# Patient Record
Sex: Female | Born: 1945 | Race: White | Hispanic: No | Marital: Married | Smoking: Never smoker
Health system: Southern US, Community
[De-identification: ages and names within clinical notes are randomized; demographics above are authoritative.]

## PROBLEM LIST (undated history)

## (undated) DIAGNOSIS — I4891 Unspecified atrial fibrillation: Secondary | ICD-10-CM

## (undated) DIAGNOSIS — R945 Abnormal results of liver function studies: Secondary | ICD-10-CM

## (undated) DIAGNOSIS — I2510 Atherosclerotic heart disease of native coronary artery without angina pectoris: Secondary | ICD-10-CM

## (undated) DIAGNOSIS — I471 Supraventricular tachycardia, unspecified: Secondary | ICD-10-CM

## (undated) DIAGNOSIS — N852 Hypertrophy of uterus: Secondary | ICD-10-CM

## (undated) DIAGNOSIS — K219 Gastro-esophageal reflux disease without esophagitis: Secondary | ICD-10-CM

## (undated) DIAGNOSIS — F5104 Psychophysiologic insomnia: Secondary | ICD-10-CM

## (undated) DIAGNOSIS — R0789 Other chest pain: Secondary | ICD-10-CM

## (undated) DIAGNOSIS — N890 Mild vaginal dysplasia: Secondary | ICD-10-CM

## (undated) DIAGNOSIS — E785 Hyperlipidemia, unspecified: Secondary | ICD-10-CM

## (undated) DIAGNOSIS — K5790 Diverticulosis of intestine, part unspecified, without perforation or abscess without bleeding: Secondary | ICD-10-CM

## (undated) DIAGNOSIS — A048 Other specified bacterial intestinal infections: Secondary | ICD-10-CM

## (undated) DIAGNOSIS — K589 Irritable bowel syndrome without diarrhea: Secondary | ICD-10-CM

## (undated) DIAGNOSIS — K760 Fatty (change of) liver, not elsewhere classified: Secondary | ICD-10-CM

## (undated) DIAGNOSIS — F419 Anxiety disorder, unspecified: Secondary | ICD-10-CM

## (undated) DIAGNOSIS — R7989 Other specified abnormal findings of blood chemistry: Secondary | ICD-10-CM

## (undated) HISTORY — DX: Abnormal results of liver function studies: R94.5

## (undated) HISTORY — DX: Hypertrophy of uterus: N85.2

## (undated) HISTORY — PX: TONSILLECTOMY: SUR1361

## (undated) HISTORY — DX: Fatty (change of) liver, not elsewhere classified: K76.0

## (undated) HISTORY — DX: Gastro-esophageal reflux disease without esophagitis: K21.9

## (undated) HISTORY — DX: Mild vaginal dysplasia: N89.0

## (undated) HISTORY — DX: Supraventricular tachycardia, unspecified: I47.10

## (undated) HISTORY — DX: Irritable bowel syndrome without diarrhea: K58.9

## (undated) HISTORY — DX: Atherosclerotic heart disease of native coronary artery without angina pectoris: I25.10

## (undated) HISTORY — DX: Other specified bacterial intestinal infections: A04.8

## (undated) HISTORY — DX: Supraventricular tachycardia: I47.1

## (undated) HISTORY — PX: PELVIC LAPAROSCOPY: SHX162

## (undated) HISTORY — DX: Psychophysiologic insomnia: F51.04

## (undated) HISTORY — DX: Other chest pain: R07.89

## (undated) HISTORY — PX: DILATION AND CURETTAGE, DIAGNOSTIC / THERAPEUTIC: SUR384

## (undated) HISTORY — DX: Anxiety disorder, unspecified: F41.9

## (undated) HISTORY — DX: Hyperlipidemia, unspecified: E78.5

## (undated) HISTORY — DX: Other specified abnormal findings of blood chemistry: R79.89

## (undated) HISTORY — PX: OTHER SURGICAL HISTORY: SHX169

## (undated) HISTORY — DX: Unspecified atrial fibrillation: I48.91

## (undated) HISTORY — PX: BREAST BIOPSY: SHX20

## (undated) HISTORY — DX: Diverticulosis of intestine, part unspecified, without perforation or abscess without bleeding: K57.90

## (undated) HISTORY — PX: REPAIR RECTOCELE: SUR1206

---

## 1972-02-15 HISTORY — PX: LAPAROSCOPY: SHX197

## 1997-12-22 ENCOUNTER — Other Ambulatory Visit: Admission: RE | Admit: 1997-12-22 | Discharge: 1997-12-22 | Payer: Self-pay | Admitting: Obstetrics and Gynecology

## 1998-04-27 ENCOUNTER — Ambulatory Visit (HOSPITAL_COMMUNITY): Admission: RE | Admit: 1998-04-27 | Discharge: 1998-04-27 | Payer: Self-pay | Admitting: Obstetrics and Gynecology

## 1998-04-27 ENCOUNTER — Encounter: Payer: Self-pay | Admitting: Obstetrics and Gynecology

## 1998-08-26 ENCOUNTER — Encounter: Payer: Self-pay | Admitting: Gastroenterology

## 1998-08-26 ENCOUNTER — Ambulatory Visit (HOSPITAL_COMMUNITY): Admission: RE | Admit: 1998-08-26 | Discharge: 1998-08-26 | Payer: Self-pay | Admitting: Gastroenterology

## 1999-03-18 HISTORY — PX: LIVER BIOPSY: SHX301

## 1999-03-18 HISTORY — PX: BREAST BIOPSY: SHX20

## 1999-04-06 ENCOUNTER — Ambulatory Visit (HOSPITAL_COMMUNITY): Admission: RE | Admit: 1999-04-06 | Discharge: 1999-04-06 | Payer: Self-pay | Admitting: Gastroenterology

## 1999-04-06 ENCOUNTER — Encounter (INDEPENDENT_AMBULATORY_CARE_PROVIDER_SITE_OTHER): Payer: Self-pay

## 1999-04-06 ENCOUNTER — Encounter: Payer: Self-pay | Admitting: Gastroenterology

## 1999-05-13 ENCOUNTER — Encounter: Admission: RE | Admit: 1999-05-13 | Discharge: 1999-08-11 | Payer: Self-pay | Admitting: Gastroenterology

## 1999-09-21 ENCOUNTER — Ambulatory Visit (HOSPITAL_COMMUNITY): Admission: RE | Admit: 1999-09-21 | Discharge: 1999-09-21 | Payer: Self-pay | Admitting: Obstetrics and Gynecology

## 1999-09-21 ENCOUNTER — Encounter (INDEPENDENT_AMBULATORY_CARE_PROVIDER_SITE_OTHER): Payer: Self-pay | Admitting: Specialist

## 1999-11-01 ENCOUNTER — Encounter: Admission: RE | Admit: 1999-11-01 | Discharge: 1999-11-01 | Payer: Self-pay | Admitting: Gynecology

## 1999-11-01 ENCOUNTER — Encounter: Payer: Self-pay | Admitting: Gynecology

## 1999-11-10 ENCOUNTER — Ambulatory Visit (HOSPITAL_COMMUNITY): Admission: RE | Admit: 1999-11-10 | Discharge: 1999-11-11 | Payer: Self-pay | Admitting: Specialist

## 2000-10-15 HISTORY — PX: OTHER SURGICAL HISTORY: SHX169

## 2000-10-15 HISTORY — PX: ROTATOR CUFF REPAIR: SHX139

## 2001-05-04 ENCOUNTER — Other Ambulatory Visit: Admission: RE | Admit: 2001-05-04 | Discharge: 2001-05-04 | Payer: Self-pay | Admitting: Gynecology

## 2001-11-20 ENCOUNTER — Ambulatory Visit (HOSPITAL_COMMUNITY): Admission: RE | Admit: 2001-11-20 | Discharge: 2001-11-20 | Payer: Self-pay | Admitting: Internal Medicine

## 2001-11-20 ENCOUNTER — Encounter: Payer: Self-pay | Admitting: Internal Medicine

## 2003-01-02 ENCOUNTER — Encounter: Admission: RE | Admit: 2003-01-02 | Discharge: 2003-01-02 | Payer: Self-pay | Admitting: Gynecology

## 2003-01-02 ENCOUNTER — Ambulatory Visit (HOSPITAL_COMMUNITY): Admission: RE | Admit: 2003-01-02 | Discharge: 2003-01-02 | Payer: Self-pay | Admitting: Internal Medicine

## 2003-02-15 HISTORY — PX: ELECTROPHYSIOLOGIC STUDY: SHX172A

## 2003-02-17 ENCOUNTER — Ambulatory Visit (HOSPITAL_COMMUNITY): Admission: RE | Admit: 2003-02-17 | Discharge: 2003-02-18 | Payer: Self-pay | Admitting: Internal Medicine

## 2003-05-16 DIAGNOSIS — A048 Other specified bacterial intestinal infections: Secondary | ICD-10-CM

## 2003-05-16 HISTORY — DX: Other specified bacterial intestinal infections: A04.8

## 2003-06-12 ENCOUNTER — Ambulatory Visit (HOSPITAL_COMMUNITY): Admission: RE | Admit: 2003-06-12 | Discharge: 2003-06-12 | Payer: Self-pay | Admitting: Cardiology

## 2003-11-20 ENCOUNTER — Other Ambulatory Visit: Admission: RE | Admit: 2003-11-20 | Discharge: 2003-11-20 | Payer: Self-pay | Admitting: Obstetrics and Gynecology

## 2004-01-05 ENCOUNTER — Encounter: Admission: RE | Admit: 2004-01-05 | Discharge: 2004-01-05 | Payer: Self-pay | Admitting: Internal Medicine

## 2004-01-05 ENCOUNTER — Ambulatory Visit: Payer: Self-pay | Admitting: Internal Medicine

## 2004-02-24 ENCOUNTER — Ambulatory Visit: Payer: Self-pay

## 2004-02-24 ENCOUNTER — Ambulatory Visit: Payer: Self-pay | Admitting: Internal Medicine

## 2004-02-24 ENCOUNTER — Encounter: Admission: RE | Admit: 2004-02-24 | Discharge: 2004-02-24 | Payer: Self-pay | Admitting: Obstetrics and Gynecology

## 2004-02-26 ENCOUNTER — Ambulatory Visit: Payer: Self-pay

## 2004-03-23 ENCOUNTER — Encounter (INDEPENDENT_AMBULATORY_CARE_PROVIDER_SITE_OTHER): Payer: Self-pay | Admitting: *Deleted

## 2004-03-23 ENCOUNTER — Inpatient Hospital Stay (HOSPITAL_COMMUNITY): Admission: RE | Admit: 2004-03-23 | Discharge: 2004-03-24 | Payer: Self-pay | Admitting: Obstetrics and Gynecology

## 2004-03-23 HISTORY — PX: VAGINAL HYSTERECTOMY: SUR661

## 2004-12-09 ENCOUNTER — Ambulatory Visit: Payer: Self-pay | Admitting: Internal Medicine

## 2005-01-10 ENCOUNTER — Ambulatory Visit: Payer: Self-pay | Admitting: Internal Medicine

## 2005-01-10 ENCOUNTER — Encounter (INDEPENDENT_AMBULATORY_CARE_PROVIDER_SITE_OTHER): Payer: Self-pay | Admitting: Specialist

## 2005-02-14 HISTORY — PX: COLPOSCOPY: SHX161

## 2005-04-11 ENCOUNTER — Ambulatory Visit (HOSPITAL_COMMUNITY): Admission: RE | Admit: 2005-04-11 | Discharge: 2005-04-11 | Payer: Self-pay | Admitting: Internal Medicine

## 2005-04-25 ENCOUNTER — Ambulatory Visit: Payer: Self-pay | Admitting: Internal Medicine

## 2005-09-15 ENCOUNTER — Ambulatory Visit: Payer: Self-pay | Admitting: Gastroenterology

## 2005-12-30 ENCOUNTER — Other Ambulatory Visit: Admission: RE | Admit: 2005-12-30 | Discharge: 2005-12-30 | Payer: Self-pay | Admitting: Obstetrics and Gynecology

## 2006-02-14 HISTORY — PX: CARDIAC CATHETERIZATION: SHX172

## 2006-05-11 ENCOUNTER — Encounter: Admission: RE | Admit: 2006-05-11 | Discharge: 2006-05-11 | Payer: Self-pay | Admitting: Endocrinology

## 2006-05-17 ENCOUNTER — Other Ambulatory Visit: Admission: RE | Admit: 2006-05-17 | Discharge: 2006-05-17 | Payer: Self-pay | Admitting: Obstetrics and Gynecology

## 2006-05-24 ENCOUNTER — Ambulatory Visit (HOSPITAL_COMMUNITY): Admission: RE | Admit: 2006-05-24 | Discharge: 2006-05-24 | Payer: Self-pay | Admitting: Internal Medicine

## 2006-06-05 ENCOUNTER — Ambulatory Visit: Payer: Self-pay | Admitting: Internal Medicine

## 2006-06-05 LAB — CONVERTED CEMR LAB
BUN: 13 mg/dL (ref 6–23)
Basophils Absolute: 0 10*3/uL (ref 0.0–0.1)
Basophils Relative: 0.2 % (ref 0.0–1.0)
CO2: 29 meq/L (ref 19–32)
Calcium: 9.5 mg/dL (ref 8.4–10.5)
Creatinine, Ser: 0.9 mg/dL (ref 0.4–1.2)
Hemoglobin: 15.2 g/dL — ABNORMAL HIGH (ref 12.0–15.0)
INR: 0.9 (ref 0.9–2.0)
MCHC: 34.7 g/dL (ref 30.0–36.0)
Monocytes Absolute: 0.7 10*3/uL (ref 0.2–0.7)
Monocytes Relative: 7.8 % (ref 3.0–11.0)
Platelets: 215 10*3/uL (ref 150–400)
Potassium: 5 meq/L (ref 3.5–5.1)
Prothrombin Time: 11.4 s (ref 10.0–14.0)
RBC: 4.88 M/uL (ref 3.87–5.11)
RDW: 12.9 % (ref 11.5–14.6)
aPTT: 26.5 s (ref 26.5–36.5)

## 2006-06-07 ENCOUNTER — Inpatient Hospital Stay (HOSPITAL_BASED_OUTPATIENT_CLINIC_OR_DEPARTMENT_OTHER): Admission: RE | Admit: 2006-06-07 | Discharge: 2006-06-07 | Payer: Self-pay | Admitting: Cardiovascular Disease

## 2006-06-07 ENCOUNTER — Ambulatory Visit: Payer: Self-pay | Admitting: Cardiovascular Disease

## 2006-07-05 ENCOUNTER — Ambulatory Visit: Payer: Self-pay | Admitting: Internal Medicine

## 2007-01-04 ENCOUNTER — Other Ambulatory Visit: Admission: RE | Admit: 2007-01-04 | Discharge: 2007-01-04 | Payer: Self-pay | Admitting: Obstetrics and Gynecology

## 2007-03-23 ENCOUNTER — Ambulatory Visit: Payer: Self-pay | Admitting: Internal Medicine

## 2007-03-23 LAB — CONVERTED CEMR LAB
Albumin: 3.5 g/dL (ref 3.5–5.2)
Alkaline Phosphatase: 128 units/L — ABNORMAL HIGH (ref 39–117)
Basophils Absolute: 0 10*3/uL (ref 0.0–0.1)
Cholesterol: 263 mg/dL (ref 0–200)
Direct LDL: 196.8 mg/dL
HDL: 34.4 mg/dL — ABNORMAL LOW (ref >39.0)
Hemoglobin: 14.4 g/dL (ref 12.0–15.0)
Lymphocytes Relative: 36.7 % (ref 12.0–46.0)
MCHC: 33.9 g/dL (ref 30.0–36.0)
Monocytes Absolute: 0.8 10*3/uL — ABNORMAL HIGH (ref 0.2–0.7)
Monocytes Relative: 9.5 % (ref 3.0–11.0)
Neutro Abs: 4.1 10*3/uL (ref 1.4–7.7)
Platelets: 197 10*3/uL (ref 150–400)
RDW: 12.6 % (ref 11.5–14.6)
Total CHOL/HDL Ratio: 7.6
Uric Acid, Serum: 6.7 mg/dL (ref 2.4–7.0)

## 2007-03-26 ENCOUNTER — Ambulatory Visit: Payer: Self-pay | Admitting: Internal Medicine

## 2007-03-27 ENCOUNTER — Ambulatory Visit (HOSPITAL_COMMUNITY): Admission: RE | Admit: 2007-03-27 | Discharge: 2007-03-27 | Payer: Self-pay | Admitting: Internal Medicine

## 2007-03-27 ENCOUNTER — Encounter: Payer: Self-pay | Admitting: Internal Medicine

## 2007-03-27 ENCOUNTER — Ambulatory Visit: Payer: Self-pay | Admitting: Internal Medicine

## 2007-04-27 ENCOUNTER — Ambulatory Visit: Payer: Self-pay | Admitting: Internal Medicine

## 2007-05-22 ENCOUNTER — Ambulatory Visit: Payer: Self-pay | Admitting: Internal Medicine

## 2007-05-22 ENCOUNTER — Encounter: Payer: Self-pay | Admitting: Internal Medicine

## 2007-07-04 ENCOUNTER — Other Ambulatory Visit: Admission: RE | Admit: 2007-07-04 | Discharge: 2007-07-04 | Payer: Self-pay | Admitting: Obstetrics and Gynecology

## 2007-07-26 ENCOUNTER — Ambulatory Visit (HOSPITAL_COMMUNITY): Admission: RE | Admit: 2007-07-26 | Discharge: 2007-07-26 | Payer: Self-pay | Admitting: Obstetrics and Gynecology

## 2008-03-27 ENCOUNTER — Ambulatory Visit: Payer: Self-pay | Admitting: Obstetrics and Gynecology

## 2008-03-27 ENCOUNTER — Encounter: Payer: Self-pay | Admitting: Obstetrics and Gynecology

## 2008-03-27 ENCOUNTER — Other Ambulatory Visit: Admission: RE | Admit: 2008-03-27 | Discharge: 2008-03-27 | Payer: Self-pay | Admitting: Obstetrics and Gynecology

## 2008-04-28 ENCOUNTER — Encounter: Payer: Self-pay | Admitting: Internal Medicine

## 2008-05-07 ENCOUNTER — Ambulatory Visit (HOSPITAL_COMMUNITY): Admission: RE | Admit: 2008-05-07 | Discharge: 2008-05-07 | Payer: Self-pay | Admitting: Internal Medicine

## 2008-05-07 ENCOUNTER — Ambulatory Visit (HOSPITAL_COMMUNITY): Admission: RE | Admit: 2008-05-07 | Discharge: 2008-05-07 | Payer: Self-pay | Admitting: Obstetrics and Gynecology

## 2008-06-20 DIAGNOSIS — Z8679 Personal history of other diseases of the circulatory system: Secondary | ICD-10-CM | POA: Insufficient documentation

## 2008-06-20 DIAGNOSIS — R0789 Other chest pain: Secondary | ICD-10-CM | POA: Insufficient documentation

## 2008-07-01 ENCOUNTER — Ambulatory Visit: Payer: Self-pay | Admitting: Internal Medicine

## 2008-11-20 ENCOUNTER — Ambulatory Visit: Payer: Self-pay | Admitting: Obstetrics and Gynecology

## 2009-04-13 ENCOUNTER — Other Ambulatory Visit: Admission: RE | Admit: 2009-04-13 | Discharge: 2009-04-13 | Payer: Self-pay | Admitting: Obstetrics and Gynecology

## 2009-04-13 ENCOUNTER — Ambulatory Visit: Payer: Self-pay | Admitting: Obstetrics and Gynecology

## 2009-06-23 ENCOUNTER — Ambulatory Visit (HOSPITAL_COMMUNITY): Admission: RE | Admit: 2009-06-23 | Discharge: 2009-06-23 | Payer: Self-pay | Admitting: Internal Medicine

## 2010-03-07 ENCOUNTER — Encounter (HOSPITAL_BASED_OUTPATIENT_CLINIC_OR_DEPARTMENT_OTHER): Payer: Self-pay | Admitting: Internal Medicine

## 2010-04-14 ENCOUNTER — Encounter: Payer: Self-pay | Admitting: Obstetrics and Gynecology

## 2010-05-25 LAB — POC HEMOCCULT BLD/STL (HOME/3-CARD/SCREEN)

## 2010-06-02 ENCOUNTER — Telehealth: Payer: Self-pay | Admitting: Internal Medicine

## 2010-06-02 NOTE — Telephone Encounter (Signed)
Spoke with Malachi Bonds and scheduled patient on 06/08/10 at 8:45 AM. Malachi Bonds to fax recent records.

## 2010-06-07 ENCOUNTER — Encounter: Payer: Self-pay | Admitting: Internal Medicine

## 2010-06-08 ENCOUNTER — Ambulatory Visit (INDEPENDENT_AMBULATORY_CARE_PROVIDER_SITE_OTHER): Payer: Medicare Other | Admitting: Internal Medicine

## 2010-06-08 ENCOUNTER — Encounter: Payer: Self-pay | Admitting: Internal Medicine

## 2010-06-08 DIAGNOSIS — K7689 Other specified diseases of liver: Secondary | ICD-10-CM

## 2010-06-08 DIAGNOSIS — R1319 Other dysphagia: Secondary | ICD-10-CM

## 2010-06-08 DIAGNOSIS — K760 Fatty (change of) liver, not elsewhere classified: Secondary | ICD-10-CM

## 2010-06-08 DIAGNOSIS — R1033 Periumbilical pain: Secondary | ICD-10-CM

## 2010-06-08 DIAGNOSIS — R195 Other fecal abnormalities: Secondary | ICD-10-CM

## 2010-06-08 MED ORDER — FLUCONAZOLE 100 MG PO TABS: ORAL_TABLET | ORAL | Status: DC

## 2010-06-08 MED ORDER — PEG-KCL-NACL-NASULF-NA ASC-C 100 G PO SOLR: 1.0000 | Freq: Once | ORAL | Status: AC

## 2010-06-08 NOTE — Patient Instructions (Addendum)
You have been scheduled for an endoscopy and colonoscopy. Please follow the written instructions given to you at your visit today. You  Have been scheduled for an abdominal ultrasound at [facility] on Tuesday 06/15/10 @8 :30 am . Please be certain not to have anything to eat or drink 6 hours prior to your test. Arrive at 8:15 am for registration. Please pick up your Moviprep at the pharmacy. Please pick up your Diflucan at the pharmacy. You should take 1 tablet daily x 3 days.

## 2010-06-08 NOTE — Progress Notes (Signed)
Heidi Chase 1945/04/18 MRN [id number]      History of Present Illness:  This is a 65 year old white female who had heme-positive stool on a test by Dr [name]. She has been complaining of upper abdominal discomfort and bloating. She has complaints of dysphagia to solids and liquids. She has been on Nexium 40 mg twice a day. Her bowel habits have been regular. She has noticed occasional small amounts of bright red blood per rectum. We have seen her in the past on multiple occasions. She has had colonoscopies in 1996, 2005 and in 2009 because of her family history of colon cancer in her father. She had 2 tubular adenomas in 2009. An upper endoscopy in 2005 showed H. pylori gastropathy. Her last endoscopy in 2009 was normal. She has fatty liver on an upper abdominal ultrasound in February 2009. Her gallbladder appeared normal and the common bile duct was 2 millimeters. In 2007, she was treated for antibiotic induced diarrhea. She had a liver biopsy in 2001 which showed severe steatohepatitis but no fibrosis.   Past Medical History  Diagnosis Date  . Hyperlipidemia   . Fatty liver   . SVT (supraventricular tachycardia)   . Uterine hyperplasia   . Positive H. pylori test 05/2003    positive x 2  . Diabetes mellitus   . Atrial fibrillation   . Elevated LFTs   . IBS (irritable bowel syndrome)   . GERD (gastroesophageal reflux disease)   . Diverticulosis   . Anxiety    Past Surgical History  Procedure Date  . Rotator cuff repair 10/2000    left  . Dilation and curettage, diagnostic / therapeutic   . Tonsillectomy   . Laparoscopy 1974  . Thumb surgery 10/2000  . Breast biopsy     left  . Total abdominal hysterectomy 03/23/04    reports that she has quit smoking. She has never used smokeless tobacco. She reports that she does not drink alcohol or use illicit drugs. family history includes Colon cancer in her father; Diabetes in her sister; Heart attack in her maternal grandfather,  maternal grandmother, mother, and paternal grandfather; Heart disease in her father and sister; and Uterine cancer in her sister. No Known Allergies      Review of Systems: Denies chest pain, shortness of breath. Admits to dysphagia. Positive for abdominal pain. Positive for rectal bleeding.  The remainder of the 10  point ROS is negative except as outlined in H&P   Physical Exam: General appearance  Well developed, in no distress. Eyes- non icteric. HEENT nontraumatic, normocephalic. Mouth no lesions, tongue papillated, no cheilosis. Neck supple without adenopathy, thyroid not enlarged, no carotid bruits, no JVD. Lungs Clear to auscultation bilaterally. Cor normal S1 normal S2, regular rhythm , no murmur,  quiet precordium. Abdomen protuberant abdomen, soft, normoactive bowel sounds. Tenderness across upper abdomen but predominantly in the epigastrium. Tenderness in left lower quadrant. No palpable mass, no rebound. Rectal soft Hemoccult negative stool. Extremities no pedal edema. Skin no lesions. Neurological alert and oriented x 3. Psychological normal mood and affect.  Assessment and Plan:  Problems #1 Patient has hemoccult-positive stool. I was unable to reproduce the same results on today's exam but, she has a strong family history of colon cancer and personal history of colon polyps and she is due for a repeat colonoscopy. The small amount of bright red blood per rectum suggests anorectal source. She has a history of irritable bowel syndrome.  Problem #2 dyspepsia and bloating. She has  a history of H. pylori. We will proceed with an upper endoscopy and assess her for an  esophageal stricture. Candida esophagitis may also cause dysphagia in a diabetic. We will treat her empirically with Diflucan 100 mg a day for 3 days. We will also obtain an upper abdominal ultrasound for followup "fatty liver". There is no stigmata of chronic liver disease on her physical exam  today.   06/08/2010 Lina Sar

## 2010-06-09 ENCOUNTER — Ambulatory Visit (AMBULATORY_SURGERY_CENTER): Payer: Medicare Other | Admitting: Internal Medicine

## 2010-06-09 ENCOUNTER — Encounter: Payer: Self-pay | Admitting: Internal Medicine

## 2010-06-09 DIAGNOSIS — R195 Other fecal abnormalities: Secondary | ICD-10-CM

## 2010-06-09 DIAGNOSIS — K625 Hemorrhage of anus and rectum: Secondary | ICD-10-CM

## 2010-06-09 DIAGNOSIS — K319 Disease of stomach and duodenum, unspecified: Secondary | ICD-10-CM

## 2010-06-09 DIAGNOSIS — R109 Unspecified abdominal pain: Secondary | ICD-10-CM

## 2010-06-09 DIAGNOSIS — R1033 Periumbilical pain: Secondary | ICD-10-CM

## 2010-06-09 DIAGNOSIS — R1319 Other dysphagia: Secondary | ICD-10-CM

## 2010-06-09 DIAGNOSIS — K219 Gastro-esophageal reflux disease without esophagitis: Secondary | ICD-10-CM

## 2010-06-09 DIAGNOSIS — K5730 Diverticulosis of large intestine without perforation or abscess without bleeding: Secondary | ICD-10-CM

## 2010-06-09 DIAGNOSIS — K921 Melena: Secondary | ICD-10-CM

## 2010-06-09 DIAGNOSIS — R0789 Other chest pain: Secondary | ICD-10-CM

## 2010-06-09 LAB — GLUCOSE, CAPILLARY: Glucose-Capillary: 81 mg/dL (ref 70–99)

## 2010-06-09 MED ORDER — SODIUM CHLORIDE 0.9 % IV SOLN: 500.0000 mL | INTRAVENOUS | Status: DC

## 2010-06-09 NOTE — Patient Instructions (Signed)
Findings:  Normal EGD                  Mild Diverticulosis, Erosion in the rectum  Recommendations:  Hold NSAIDS (aspirin products) only take tylenol for over the counter pain meds.                                   Resume PPI                                   Preparation H as needed for rectal bleeding.

## 2010-06-10 ENCOUNTER — Other Ambulatory Visit: Payer: Self-pay | Admitting: Obstetrics and Gynecology

## 2010-06-10 ENCOUNTER — Telehealth: Payer: Self-pay | Admitting: *Deleted

## 2010-06-10 NOTE — Telephone Encounter (Signed)
Follow up Call- Patient questions:  Do you have a fever, pain , or abdominal swelling? no Pain Score  0 *  Have you tolerated food without any problems? yes  Have you been able to return to your normal activities? yes  Do you have any questions about your discharge instructions: Diet   no Medications  yes Follow up visit  no  Do you have questions or concerns about your Care? no  Actions: * If pain score is 4 or above: No action needed, pain <4.  Pt had questions about NSAIDS and what she should avoid.

## 2010-06-15 ENCOUNTER — Ambulatory Visit (HOSPITAL_COMMUNITY)
Admission: RE | Admit: 2010-06-15 | Discharge: 2010-06-15 | Disposition: A | Discharge status: Home / Self Care | Payer: Medicare Other | Source: Ambulatory Visit | Attending: Internal Medicine | Admitting: Internal Medicine

## 2010-06-15 DIAGNOSIS — K7689 Other specified diseases of liver: Secondary | ICD-10-CM | POA: Insufficient documentation

## 2010-06-15 DIAGNOSIS — K760 Fatty (change of) liver, not elsewhere classified: Secondary | ICD-10-CM

## 2010-06-15 DIAGNOSIS — R1033 Periumbilical pain: Secondary | ICD-10-CM

## 2010-06-15 DIAGNOSIS — K824 Cholesterolosis of gallbladder: Secondary | ICD-10-CM | POA: Insufficient documentation

## 2010-06-15 DIAGNOSIS — R109 Unspecified abdominal pain: Secondary | ICD-10-CM | POA: Insufficient documentation

## 2010-06-15 DIAGNOSIS — E119 Type 2 diabetes mellitus without complications: Secondary | ICD-10-CM | POA: Insufficient documentation

## 2010-06-17 ENCOUNTER — Encounter: Payer: Self-pay | Admitting: Internal Medicine

## 2010-06-17 ENCOUNTER — Telehealth: Payer: Self-pay | Admitting: *Deleted

## 2010-06-17 NOTE — Telephone Encounter (Signed)
Patient given results as per Dr. Edman Circle

## 2010-06-17 NOTE — Telephone Encounter (Signed)
Message copied by Jesse Fall on Thu Jun 17, 2010  2:43 PM ------      Message from: Lina Sar      Created: Wed Jun 16, 2010 11:50 PM       Please call pt with normal sono except for fatty liver unchanged from prior exam

## 2010-06-29 NOTE — Letter (Signed)
Jul 05, 2006    Barry Dienes. [name], [name]  [address]  [place], [place] [address]   RE:  [name], [name]  MRN:  [id number]  /  DOB:  1945/02/15   Dear [name],   Heidi Chase comes in and her catheterization was normal as you know. She  continues to have chest pain as well as exercise intolerance. She has a  great deal of stress ongoing with her impending mediation with her  husband following their divorce. She has had some significant problems  with stomach cramping which is has been self limited.   CURRENT MEDICATIONS:  1. Nexium 40.  2. Lexapro that she is back on after having failed the Wellbutrin.  3. Toprol 50 mg a day.   PHYSICAL EXAMINATION:  VITAL SIGNS:  Her blood pressure is 124/86, her  pulse is 52.  LUNGS:  Clear.  HEART:  Sounds were regular.  EXTREMITIES:  Without edema.   Electrocardiogram  demonstrated sinus rhythm at 52 with intervals of  0.15/0.09/0.45. The axis was 44 degrees.   IMPRESSION:  1. Noncardiac chest pain.  2. History of atrial arrhythmia.  3. Ongoing chest pain probably gastrointestinal.  4. Exercise intolerance question related to:      a.     Chronotropic incompetence.      b.     Beta blocker effect.  5. Significant psychosocial stress.   Dan, I thought what we might try is cutting down Heidi Chase's Toprol  from 50-25 and see how it is that she did and whether there is any  significant improvement. The reduction might beneficially effect either  chronotropic incompetence or an exercise asthma component.   I have asked her to give me a call in a couple of weeks after she down  titrates from 50 to 25 mg a day but otherwise will plan to have her  followup with you and I will be glad to see her again as needed. Thank  you very much for allowing [place] to participate in her care.    Sincerely,      Duke Salvia, MD, [facility]  Electronically Signed    SCK/MedQ  DD: 07/05/2006  DT: 07/05/2006  Job #: [id number]

## 2010-06-29 NOTE — Assessment & Plan Note (Signed)
[facility]                         GASTROENTEROLOGY OFFICE NOTE   NAME:VERNONSrinika, Delone                        MRN:          [id number]  DATE:04/27/2007                            DOB:          [birth year]    [name] is a 65 year old white female with family history of colon  cancer, fatty liver documented on liver biopsy, diverticulosis of the  left colon on colonoscopies.  The last exam was done in April 2005.  She  would be due for next colonoscopy in April 2010.  She has also a history  of atrial fibrillation and H. pylori gastropathy, which was treated in  April 2005, and again in October 2006.  She comes today with episode of  bright red blood per rectum, which occurred approximately a week ago,  while traveling in her car from [place] to [place].  It was a  long trip and she had a bowel movement on the way in a restaurant, and  saw a large amount of blood.  There was also associated rectal soreness,  which since then, has subsided, after she called [place] and we gave her  Anusol-HC suppositories.  Patient denies history of hemorrhoids.  She  denies being constipated.  She denies abdominal pain.   PHYSICAL EXAM:  Blood pressure 128/86, pulse 68 and weight 165 pounds.  She was alert, oriented, in no distress.  LUNGS:  Clear to auscultation.  COR:  With normal S1, normal S2.  ABDOMEN:  Protuberant, tender in the right upper quadrant.  RECTAL:  The rectal and anoscopic exam reveals a small skin tag  externally, normal rectal tone, no significant hemorrhoids internally.  Rectal mucosa appeared normal.  Stool was Hemoccult-negative.  I could  not appreciate any fissure and patient was not particularly tender on  the digital exam.   IMPRESSION:  Patient is a 65 year old white female with episode of  hematochezia a week ago, with no noticeable rectal lesions on anoscopic  exam today.  I am not sure if she had an anal fissure which has healed  up  so we cannot really see it, or if she possibly had a low grade  diverticular bleed.  She will be due for colonoscopy next year, but  because of the circumstances and her concerns, we will proceed with  colonoscopic exam at this time.   PLAN:  1. Colonoscopy scheduled.  2. Analpram cream 2.5% samples given.  3. Continue weight-loss.  4. Continue high-fiber diet.     Hedwig Morton. Juanda Chance, MD  Electronically Signed    DMB/MedQ  DD: 04/27/2007  DT: 04/27/2007  Job #: [id number]   cc:   Barry Dienes. [name], [name]

## 2010-06-29 NOTE — Assessment & Plan Note (Signed)
[facility]                         GASTROENTEROLOGY OFFICE NOTE   NAME:[name], Heidi Chase                        MRN:          [id number]  DATE:03/26/2007                            DOB:          07-Feb-1946    [name] is a 65 year old white female who is here today with right  upper quadrant abdominal pain and history of epigastric discomfort and  dyspepsia.  We have followed her now for many years for colorectal  screening.  She has a positive family history of colon cancer in her  father and underwent several colonoscopies, our last one in April 2005.  She is due for repeat colonoscopy in April of 2010.  The patient is also  known to have steatohepatitis, documented on percutaneous liver biopsy  in February 2001 showing rather severe fatty liver without any evidence  of fibrosis.  The patient has no history of alcohol use.  She had a  positive H. pylori test on endoscopy in April 2005.  Last repeat  endoscopy in October 2006 showed negative CLO test, but she still had  acute gastritis.  She is having persistent right upper quadrant  abdominal discomfort.  This bothers her at night.  She is unable to lay  on her right side.  It is worse with moving around or bending.  It is a  constant discomfort and soreness.   MEDICATIONS:  1. Toprol XL 50 mg p.o. daily.  2. Nexium 40 mg p.o. daily.  3. Lexapro 10 mg p.o. daily.  4. Diazepam 2 mg p.r.n.   PHYSICAL EXAMINATION:  Blood pressure 142/78, pulse 68, and weight 163  pounds.  She was alert and oriented, no distress.  Moderately overweight.  LUNGS:  Clear to auscultation.  COR:  Normal S1, normal S2.  ABDOMEN:  Protuberant and very tender along the right costal margin.  The liver was about 2 cm below right costal margin.  Lower abdomen was  normal.  Left lower and upper quadrants were normal.  There was mild  right CVA tenderness and tenderness laterally.  When pounding over the  liver, the patient  had definite discomfort on the right upper quadrant,  but no discomfort on the left upper quadrant.   IMPRESSION:  A 65 year old white female with steatohepatitis with mild  hepatomegaly, but no evidence of cirrhosis by biopsy in 2001.  She has  abnormality of liver function tests that have progressed since last exam  about a year ago.  Most recently, her alkaline phosphatase is 128 with  AST of 84 and ALT of 116.  She has total cholesterol of 263 with LDL of  168.   PLAN:  1. The patient needs to start on serious weight loss which may be      effective in reducing the size of her hepatomegaly.  2. Upper endoscopy to assess epigastric discomfort and dyspepsia and      recheck H. pylori.  3. Upper abdominal ultrasound.  4. Valium refill 2 mg dispense 40 by Dr. __________.  5. Colonoscopy recall in October 2010.     Heidi Chase. Heidi Chance, MD  Electronically Signed    DMB/MedQ  DD: 03/26/2007  DT: 03/27/2007  Job #: [id number]   cc:   Heidi Chase. Heidi Chase, [name]

## 2010-07-02 NOTE — Cardiovascular Report (Signed)
NAMEMONTSERRATH, MADDING                 ACCOUNT NO.:  [id number]   MEDICAL RECORD NO.:  [id number]          PATIENT TYPE:  OIB   LOCATION:  1961                         FACILITY:  [facility]   PHYSICIAN:  Peter C. Eden Emms, MD, FACCDATE OF BIRTH:  03-08-1945   DATE OF PROCEDURE:  06/07/2006  DATE OF DISCHARGE:                            CARDIAC CATHETERIZATION   PROCEDURES:  Coronary angiography.   CORONARY RISK FACTORS:  Substernal chest pain.   Cine catheterization done with 5-French catheters from the right femoral  artery.   The patient tolerated the procedure well. Standard JR4 and  JL-4  catheters were used.   The patient did have some oozing around the sheath during the case;  however, after sheath pull, there was no significant hematoma and no  bleeding.   The left main coronary artery was normal.   Left anterior descending artery was normal.   There was a very large first diagonal branch which was normal.  The  distal LAD was somewhat small.   Circumflex coronary artery was nondominant and normal.  There were two  large obtuse marginal branches which were normal.   Right coronary artery was dominant and normal.   RAO ventriculography:  RAO ventriculography had some PVCs.  However,  overall wall motion was normal with an EF of 55%.  There was no gradient  across the aortic valve and no MR.  The aortic pressure was 130/71. LV  pressure is 130/6.   IMPRESSION:  The patient's chest pain would appear to be noncardiac in  etiology.  She will follow up Dr. [name].  Apparently there is some  family stress at this time.   She will also follow up with Dr. Graciela Husbands in regards to her history of  atrial arrhythmia.      Noralyn Pick. Eden Emms, MD, [facility]  Electronically Signed     PCN/MEDQ  D:  06/07/2006  T:  06/07/2006  Job:  [id number]   cc:   Duke Salvia, MD, [facility]  Daniel L. Eda Paschal, [name]  Barry Dienes [name], [name]

## 2010-07-02 NOTE — H&P (Signed)
[facility]  Patient:    Heidi Chase, Heidi Chase                          MRN: [id number] Attending:  Esmeralda Arthur, [name]                         History and Physical  HISTORY OF PRESENT ILLNESS:       This is a 65 year old female, para 2-0-2, admitted to the hospital for hysteroscopy and D&C because of thickened endometrium.  The patient has had no bleeding since March 1999.  She has hot flushes three to four a day, some at night, but she does not take any estrogen.  She has taken no progesterone either.                                    She also has fatty liver which has been proven by biopsy.                                    She has vaginal dryness and relates to dyspareunia and orgasms have decreased.                                    Ultrasound revealed uterus to be normal size but with a 6 mm endometrial stripe.  Her right ovary is 1.7 x 2.1 x 1.6.  her left ovary was 2 x 1.4 x 2 cm.                                    Her last menstrual period was March 1999. She does not smoke.  She does not use alcohol.  She has some coffee every day.  her nutrition, she states she is trying to do better and eat a fat-free diet.  She exercises five days a week.  REVIEW OF SYSTEMS:                She has had some vision changes.  It is time for exam of her eyes.  Cardiovascular is negative.  Respiratory is negative. Gastrointestinal: She has a flare up every once in a while.  Bladder: She gets up two to three times at night to empty.  Her left breast is sensitive under arm.  PERSONAL PAST HISTORY:            She has had no problems with heart, lungs, or stroke.  She does have irritable bowel syndrome and she has arthritis pains.  The patient had a laparoscopy in 1994 which was consistent with endometriosis.  FAMILY HISTORY:                   She has two maternal aunts with diabetes. Her oldest sister had a stroke.  Her mom, dad, and her sister have  heart disease, and they have had high blood pressure.  Her mom and dad both have had colon cancer.  PHYSICAL EXAMINATION:  GENERAL:                          Well-developed,  well-nourished female, oriented and alert.  VITAL SIGNS:                      Blood pressure 128/78.  Weight 143.  Height 5 feet 3-1/2 inches.  NECK:                             Thyroid not palpable.  LUNGS:                            Clear to percussion and auscultation.  HEART:                            Normal sinus rhythm.  ABDOMEN:                          Liver is not palpated.  Spleen is not palpated.  Hernia is not palpated.  PELVIC:                           External genitalia within normal limits. Cervix within normal limits.  In vagina, she has good support.  Cervix is epithelialized.   Ovaries not palpable.  Adnexa without masses.  Rectovaginal confirms.  IMPRESSION:                       1. Postmenopausal.                                   2. Thickened endometrium.                                   3. Not on hormone replacement therapy,                                      having hot flushes.                                   4. Vaginal dryness, to use hormone cream.                                   5. Fatty liver by biopsy.  DISPOSITION:                      She is admitted for surgery. DD:  09/20/99 TD:  09/20/99 Job: [id number]

## 2010-07-02 NOTE — Op Note (Signed)
NAME:  Heidi Chase, Heidi Chase                           ACCOUNT NO.:  [id number]   MEDICAL RECORD NO.:  [id number]                   PATIENT TYPE:  OIB   LOCATION:  2853                                 FACILITY:  [facility]   PHYSICIAN:  Duke Salvia, [name]               DATE OF BIRTH:  [birth year]   DATE OF PROCEDURE:  02/17/2003  DATE OF DISCHARGE:                                 OPERATIVE REPORT   PREOPERATIVE DIAGNOSIS:  Supraventricular tachycardia/nonsustained atrial  tachycardia.   POSTOPERATIVE DIAGNOSIS:  Multiple a trial flutters that appeared to be left-  sided as well as atrial fibrillation.   OPERATION PERFORMED:  Invasive electrophysiologic study with isoproterenol  infusion.   SURGEON:  Duke Salvia, [name]   DESCRIPTION OF PROCEDURE:  Following the obtaining of informed consent, the  patient was brought to the electrophysiology laboratory and placed on the  fluoroscopic table in supine position.  After routine prep and drape,  cardiac catheterization was performed with local anesthesia and conscious  sedation.  Noninvasive blood pressure monitoring, transcutaneous oxygen  saturation monitoring, end tidal CO2 monitoring were performed continuously  throughout the procedure.  Following the procedure, the catheters were  removed. Hemostasis was obtained and the patient was transferred to the day  hospital in stable condition.   CATHETERS:  1. A 5 French quadripolar catheter was inserted via the left femoral vein to     the AV junction to record the His electrogram.  2. A 5 French quadripolar catheter was inserted via the left femoral vein to     the right ventricular apex.  3. A 5 French quadripolar catheter was inserted via the left femoral vein to     the high right atrium and subsequently substituted for a halo catheter.  4. A 6 French octapolar catheter was inserted via the right femoral vein to     the coronary sinus.   Surface leads 1, aVF, and V1 were monitored  continuously throughout the  procedure.  Following insertion of the catheters, the stimulation protocol  included  1. Incremental atrial pacing.  2. Incremental ventricular pacing.  3. Single atrial extrastimuli at a pace cycle length of 600 msec.  4. Double atrial extrastimuli at a pace cycle length of 500 msec.  5. Burst atrial pacing.   RESULTS:  Surface electrocardiogram and basic intervals.  Rhythm is sinus.  Cycle length is 996 msec.  P-R interval is 171 msec.  QRS duration is 106 msec.  QT interval is 396 msec.  P wave duration is 106 msec.  Bundle branch block was absent.  Pre-excitation was absent.  AH interval was 109 msec.  HV interval was 50 msec.  His bundle duration was 22 msec.   AV NODAL FUNCTION:  AV Wenckebach was 370 msec.  VA Wenckebach was 550 msec.  AV nodal effective refractoriness at a pace cycle length of 600 msec was 280  msec, and AV nodal conduction was continuous.  No evidence of an accessory pathway was identified.   ARRHYTHMIAS INDUCED:  A number of atrial arrhythmias were induced with burst  atrial pacing.  Two of these were a flutter like rhythm with a cycle length  of approximately 175 msec.  The first of these had earliest right atrial  activation, His bundle electrogram and then there was bidirectional  activation of the right atrium from this point.   The second atrial flutter also had a cycle length of approximately 175 msec.  Periannular conduction incorporated 100% of the tachycardia cycle length;  however, attempts to entrain the tachycardia resulted in its termination.  In addition, the initial rhythms that were induced were much more irregular.  There was evidence of double potentials identified in the proximal coronary  sinus; these electrograms both converged and diverged as the electrograms  moved from proximal to distal suggesting that this was a passive aspect of  the circuit.   In conclusion, results of the electrophysiologic  testing identified a number  of atrial flutters as well as atrial fibrillation as inducible arrhythmias.  Further review of the patient's external monitor supported the observation  of atrial tachycardias as potential triggering mechanism as there were short  runs and longer runs of the tachycardia that had minimal warm up  acceleration but did occur in small bursts.  For now, I think medical  therapy is most appropriate.  Given the potential issues related to left  atrial ablation, a trial of antiarrhythmic drug therapy may in fact be  warranted prior to proceeding with that type of procedure.    mapping identifying a relatively caudal site, lidocaine was infiltrated 3 to  4 cm caudal to the clavicle and 1.5 cm lateral to the sternum.  An incision  was made and carried down to the layer of the prepectoral fascia using  electrocautery and sharp dissection.  A pocket was formed similarly,  hemostasis was obtained.   The wound was washed, dried and a benzoin and Steri-Strip dressing was  applied.  Sponge, needle and instrument counts were correct at the end of  the procedure according to staff.  The patient tolerated the procedure  without apparent complication.                                               Duke Salvia, [name]    SCK/MEDQ  D:  02/17/2003  T:  02/17/2003  Job:  [id number]   cc:   Barry Dienes. [name], [name]  [address]  [place]  [place] [address]  Fax: [phone]

## 2010-07-02 NOTE — H&P (Signed)
Heidi Chase, Heidi Chase                 ACCOUNT NO.:  [id number]   MEDICAL RECORD NO.:  [id number]          PATIENT TYPE:  INP   LOCATION:  0005                         FACILITY:  [facility]   PHYSICIAN:  Daniel L. Gottsegen, M.D.DATE OF BIRTH:  [birth year]   DATE OF ADMISSION:  03/23/2004  DATE OF DISCHARGE:                                HISTORY & PHYSICAL   CHIEF COMPLAINT:  Symptomatic uterine prolapse and rectocele.   HISTORY OF PRESENT ILLNESS:  The patient is a 65 year old gravida 2, para 2,  AB 0 who had presented to the office with a feeling that her uterus had  dropped and she was especially aware of this while on her feet as well as  even more so inability to get stool out.  She finds that the only way she  can now get stool out is with perineal pressure.  On examination she has  significant uterine prolapse and a rectocele to explain the above.  She  enters the hospital now for definitive surgery.  Surgery will consist of a  vaginal hysterectomy, posterior repair.  We have had a very long discussion  of pros and cons of removing her ovaries.  She appreciates the issues  including the concern about ovarian cancer, however, she would like to keep  her ovaries as long as they are healthy.   PAST MEDICAL HISTORY:  The patient had a previous D and C for endometrial  polyp and a submucous fibroid.  Other surgery is a rotator cuff surgery.  Her present medical problems include irritable bowel syndrome, mild  diverticulosis, steatohepatitis with elevated liver functions followed by  Dr. Lina Sar who feels that there is no contraindication to surgery.  From a cardiac point of view she has had a history of atrial fibrillation.  She has also had chest pain, hypertension, hyperlipidemia.  She is followed  by Dr. Jens Som.  She had a preoperative stress  Myoview study which was  normal and he felt that she did not have any contraindications to surgery.   CURRENT MEDICATIONS:  1.  Lipitor  10 mg daily.  2.  Toprol XL 50 mg daily.  3.  Nexium 40 mg daily.  4.  Lexapro 10 mg daily.  5.  Darvocet PRN.  6.  Xanax PRN.   ALLERGIES:  She is allergic to no drugs.   FAMILY HISTORY:  Father and mother and sister all have had coronary artery  disease.  She has two sisters that are diabetic.  She has two sisters who  have hypertension.  Her father also had colon cancer and her sister had  uterine cancer.   REVIEW OF SYMPTOMS:  HEENT:  Basically negative.  CARDIAC:  See above.  GASTROINTESTINAL:  See above.  RESPIRATORY:  Negative.  MUSCULOSKELETAL:  Basically negative.  NEUROLOGICAL AND PSYCHIATRIC reveals a history of  depression and anxiety on Lexapro.  ALLERGIC/IMMUNOLOGICAL/LYMPHATIC/ENDOCRINE:  Negative.   PHYSICAL EXAMINATION:  GENERAL APPEARANCE:  The patient is a well-developed,  well-nourished female in no acute distress.  VITAL SIGNS:  Blood pressure 116/70. Pulse is 80 and regular.  Respirations  16 and nonlabored.  She is afebrile.  HEENT:  Within normal limits.  NECK:  Supple.  Trachea is midline.  Thyroid is not enlarged.  LUNGS:  Clear to auscultation and percussion and auscultation.  HEART:  No thrills, heaves or murmurs.  BREASTS:  No masses.  ABDOMEN:  Soft without guarding, rebound or masses.  PELVIC:  External is normal.  BUS is normal.  Bladder is normal.  Vaginal  examination reveals a 2.5 degree rectocele.  Cervix is clean.  Pap smear is  normal.  Uterus is retroverted, normal size and shape with second degree  descensus.  Adnexa are not enlarged.  Rectovaginal is confirmatory.   ADMISSION IMPRESSION:  Uterine prolapse with rectocele.   PLAN:  Surgery as outlined above.      DLG/MEDQ  D:  03/23/2004  T:  03/23/2004  Job:  [id number]

## 2010-07-02 NOTE — Discharge Summary (Signed)
NAME:  Heidi Chase, Heidi Chase                           ACCOUNT NO.:  [id number]   MEDICAL RECORD NO.:  [id number]                   PATIENT TYPE:  OIB   LOCATION:  6532                                 FACILITY:  [facility]   PHYSICIAN:  Duke Salvia, [name]               DATE OF BIRTH:  1945/12/13   DATE OF ADMISSION:  02/17/2003  DATE OF DISCHARGE:                                 DISCHARGE SUMMARY   ADDENDUM:  After discussion with Dr. Graciela Husbands, Cardizem dose was increased to  240 mg daily.      Chinita Pester, C.R.N.P. [facility]                 Duke Salvia, [name]    DS/MEDQ  D:  02/18/2003  T:  02/18/2003  Job:  [id number]

## 2010-07-02 NOTE — Op Note (Signed)
Heidi Chase, Heidi Chase                 ACCOUNT NO.:  [id number]   MEDICAL RECORD NO.:  [id number]          PATIENT TYPE:  INP   LOCATION:  0005                         [facility]AC[facility]   PHYSICIAN:  Daniel L. Gottsegen, M.D.DATE OF BIRTH:  08-06-1945   DATE OF PROCEDURE:  03/23/2004  DATE OF DISCHARGE:                                 OPERATIVE REPORT   PREOPERATIVE DIAGNOSIS:  Uterine prolapse and rectocele.   POSTOPERATIVE DIAGNOSIS:  Uterine prolapse and rectocele.   OPERATION PERFORMED:  Vaginal hysterectomy and posterior repair.   SURGEON:  Daniel L. Eda Paschal, [name]   ASSISTANTGaetano Hawthorne. Lily Peer, [name]   ANESTHESIA:  General endotracheal.   FINDINGS:  Uterus was retroverted, normal size and shape with normal second  degree prolapse.  Ovaries, fallopian tubes and pelvic peritoneum were free  of disease.  The patient had a 2-1/2 degree rectocele.   DESCRIPTION OF PROCEDURE:  After adequate general endotracheal anesthesia,  the patient was placed in dorsal lithotomy position, prepped and draped in  the usual sterile manner.  A 1:200,000 solution of epinephrine and 0.5%  Xylocaine was injected around the cervix.  A 360 degree incision was made  around the cervix.  The bladder was mobilized superiorly as was the  posterior peritoneum.  The posterior peritoneum and vesicouterine fold of  peritoneum were entered with sharp dissection.  The uterosacral ligaments  were clamped.  In clamping them they were shortened and they were sutured to  the vault laterally for good vault support.  Cardinal ligaments, uterine  arteries, balance of the broad ligament, utero-ovarian ligaments, round  ligaments and fallopian tubes were successively clamped, cut and suture  ligated with #1 chromic catgut.  The uterus was delivered and sent to  pathology for tissue diagnosis.  The ovaries and tubes were inspected and  were normal and as per patient's request were not removed.  The vaginal cuff  was  then sutured to the peritoneum with a running locking 0 Vicryl.  Copious  irrigation was done with Ringer's lactate.  The peritoneum and cuff were  then closed with figure-of-eights of #1 chromic catgut.  Attention was then  turned to the posterior repair.  Starting at the introitus, the mucosa was  undermined all the way to the top of the cuff.  The perirectal fascia and  the rectocele were sharply dissected free.  Part of the dissection was done  with the surgeon having his hand in the rectum for good definition of the  rectum and then the rectocele was reduced with approximately eight  interrupted sutures of 2-0 Vicryl incorporating perirectal fascia to get rid  of the rectocele.  There was very little redundant vaginal mucosa.  The  small amount that was redundant was trimmed and then the posterior vaginal  mucosa was closed with a running 2-0 Vicryl, also picking up perirectal  fascia below it to reinforce the repair and also to eliminate dead space.  At the termination of the  procedure, there was no bleeding noted.  The vagina was packed with one inch  iodoform.  A Foley  catheter was placed which drained clear urine.  Blood  loss was 100 mL.  The patient tolerated the procedure well and left the  operating room in satisfactory condition.      DLG/MEDQ  D:  03/23/2004  T:  03/23/2004  Job:  [id number]

## 2010-07-02 NOTE — Letter (Signed)
June 05, 2006    Barry Dienes. [name], [name]  [address]  [place], [place] [address]   RE:  ALEXSANDRIA, KIVETT  MRN:  [id number]  /  DOB:  1945-03-11   Dear [name]:   It was a pleasure to see Kalani Baray today at your request because of  her recurrent chest pain.   As you know, she is a woman whom I met some years ago because of atrial  arrhythmias.  She underwent EP testing and was found to have multiple  atrial arrhythmias and was then treated electively with Toprol, which  has been really apparently quite effective in controlling her symptoms.  We had talked about the potential use for a 1C agent, but her symptoms  have not dictated the need for that.  We had also previously treated her  with Diltiazem, but I think you must have gradually transitioned that  over to the beta blocker, which she seems to be tolerating well.   Of note is that over the last 4-5 months she has had recurrent problems  with exertional chest tightness that has been accompanied by shortness  of breath.  The chest discomfort does not radiate.  The shortness of  breath resolves with rest.  If she then starts walking again, she does  not have significant recurrence of symptoms in tat situation.   CARDIAC RISK FACTORS:  Notable for a family history of  hypercholesterolemia.  She does not have diabetes, does not use  cigarettes or have hypertension.  Her family history, though is really  quite striking involving the women of her family.   She also has a history of GE reflux disease and has a non-exertional  chest discomfort that is quite different from the above.  This has been  treated with a PPI with marked improvement.   She also notes of late that she has had peripheral edema.  This has been  concurrent with the chest pain.  She does not, however, have orthopnea  or nocturnal dyspnea.   PAST MEDICAL HISTORY:  In addition to the above is notable for:  1. Hyperglycemia with a negative hemoglobin A1c.  2.  Hyperlipidemia.  3. Elevated LFT's thought to complicate the above, but ultimately      related to fatty liver.  4. Anxiety depression typically related to her divorce after 42 years      of marriage.   REVIEW OF SYSTEMS:  Her review of systems in addition to the above is  notable for:  1. Cough times greater than a year.  2. GU infections.  3. Headaches.  4. Recurrent diaphoresis that has benign going on for a couple of      years.  5. Arthritis in her hands.  6. Multiple other organ systems negative.   SOCIAL HISTORY:  She is divorced.  She has 2 children.  She is living  with her son currently.   PAST SURGICAL HISTORY:  1. Hysterectomy.  2. Rotator cuff surgery.   CURRENT MEDICATIONS:  1. Lexapro 10 being transitioned to Wellbutrin.  2. Nexium 40.  3. Aspirin 325.  4. Toprol 50.  5. Lipitor 80.   ALLERGIES:  She has no known drug allergies.   PHYSICAL EXAMINATION:  VITAL SIGNS:  Her blood pressure is 145/86, pulse  of 69.  Weight was 163.  HEENT:  No icterus or xanthoma.  NECK:  The neck veins were flat.  Carotids were brisk and full  bilaterally without bruits.  BACK:  Without kyphosis or scoliosis.  LUNGS:  Clear.  HEART:  Heart sounds were regular without murmurs or gallops.  ABDOMEN:  Soft with active bowel sounds without midline pulsation or  hepatomegaly.  EXTREMITIES:  Femoral pulses were 2+.  Distal pulses were intact.  There  is no clubbing, cyanosis, or edema.  NEUROLOGIC:  Grossly normal.  SKIN:  Warm and dry.   ELECTROCARDIOGRAM:  EKG dated today demonstrated sinus rhythm at 59 with  intervals at 0.15/0.09/0.42.  The axis was 38 degrees.   IMPRESSION:  1. New onset exertional chest discomfort and shortness of breath.  2. Cardiac risk factors notable for family history and dyslipidemia.  3. Gastroesophageal reflux disease with a different chest pain      syndrome.  4. Stress and anxiety related to a recent divorce after 42 years of       marriage.  5. Chronic cough.  6. Persisting and recurrent diaphoresis.   Dan, I am concerned about Mrs. Jodene Nam chest pain syndrome,  particularly in the family history.  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  The assurance related to a negative study here would be  helpful, but I have this uneasy feeling that we are going to find more  disease than I would have anticipated from her Myoview scan.   I have reviewed the above with her including the potential benefits, as  well as the potential risks, including, but not limited to, stroke,  perforation, vascular injury and death.  She understands these risks  and, notwithstanding, is willing to proceed.   Will plan to go ahead and get a chest x-ray today, as well, as I  mentioned.   Thanks again for this consultation.    Sincerely,      Duke Salvia, MD, [facility]  Electronically Signed    SCK/MedQ  DD: 06/05/2006  DT: 06/06/2006  Job #: [id number]

## 2010-07-02 NOTE — H&P (Signed)
Heidi Chase, Heidi Chase                 ACCOUNT NO.:  [id number]   MEDICAL RECORD NO.:  [id number]          PATIENT TYPE:  INP   LOCATION:  0005                         FACILITY:  [facility]   PHYSICIAN:  Daniel L. Gottsegen, M.D.DATE OF BIRTH:  1945/06/10   DATE OF ADMISSION:  03/23/2004  DATE OF DISCHARGE:                                HISTORY & PHYSICAL   Audio too short to transcribe (less than 5 seconds)      DLG/MEDQ  D:  03/23/2004  T:  03/23/2004  Job:  [id number]

## 2010-07-02 NOTE — Op Note (Signed)
[facility]  Patient:    [name], Heidi Chase                        MRN: [id number] Proc. Date: 09/21/99 Adm. Date:  [id number] Attending:  Amanda Cockayne                           Operative Report  PREOPERATIVE DIAGNOSIS:       Thickened endometrium.  POSTOPERATIVE DIAGNOSIS:      Thickened endometrium due to endometrial                               polyp, and polyp removed.  OPERATION:  SURGEON:                      Esmeralda Arthur, [name]  ANESTHESIA:                   General.  PACKS:                        None.  CATHETERS:                    None.  ______:                      Sorbitol.  DEFICIT:                      60 cc.  FINDINGS:                     Large endometrial polyp.  DESCRIPTION OF PROCEDURE:     The patient was taken to the operating room and after satisfactory general anesthesia was placed in the lithotomy position, and she was prepped and draped in the usual manner.  The bladder was emptied by catheterization.  Examination revealed the uterus tip was posterior.  No masses felt in the adnexa.  A weighted speculum was placed in the posterior vagina.  The cervix was grasped with a tenaculum and sounded to 7 cm.  The cervix was then dilated to a #23 Hegar dilator.  When observed with the observation scope, we could see a large endometrial polyp which ______ the uterus.  We could see the opening at the tubal cornua.  These were clear and the rest of the uterus was atrophic.                                We then withdrew the scope, put the Randall stone grasping forceps and after some manipulation, removed most of the polyp and removed several more pieces.  We then did a curettage and removed the stump of the polyp.                                After the curettage, we then relooked with the scope and could see basically nothing.                                The procedure was terminated.  The patient  was carried to the recovery room in  good condition. DD:  09/21/99 TD:  09/22/99 Job: [id number]

## 2010-07-02 NOTE — Op Note (Signed)
[facility]  Patient:    Chase, Heidi                        MRN: [id number] Proc. Date: 11/10/99 Adm. Date:  [id number] Attending:  Pierce Crane                           Operative Report  PREOPERATIVE DIAGNOSES:  Rotator cuff tear, impingement syndrome of the left shoulder, adhesive capsulitis.  POSTOPERATIVE DIAGNOSES:  Rotator cuff tear, impingement syndrome of the left shoulder, adhesive capsulitis.  OPERATION PERFORMED:  Open acromioplasty, rotator cuff repair, lysis of adhesions.  ANESTHESIA:  General.  ASSISTANT:  Maud Deed, P.A.-C.  BRIEF HISTORY:  This 65 year old female is having refractory left shoulder pain, positive impingement sign and MRA indicating significant tendinous, degenerated tear of the supraspinatus tendon with full thickness of the lateral subacromion with an osteophytic spur predisposing the torn region. The patient additionally has a C5-6 disk herniation and facet radiculopathy. Operative intervention was indicated for decompression of the subacromial space, lysis of adhesions, and evaluation and inspection of the rotator cuff with excision of the degenerated portion. Risks and benefits were discussed including bleeding, infection, damage to neurovascular structures, recurrent tear, adhesive capsulitis, etc.  DESCRIPTION OF PROCEDURE:  The patient in supine beach chair position and after induction of adequate general anesthesia and 1 gm of Kefzol, the left shoulder precordial region and upper extremity was prepped and draped in the usual sterile fashion. The cervical spine was positioned in neutral. An anterior incision bisecting the acromion and Langers lines was made through the skin after infiltration with 0.25% Marcaine with epinephrine. The subcutaneous tissue was dissected, electrocautery was utilized to achieve hemostasis. The raphe between the anterior lateral heads of the deltoid was identified  and debrided. The lateral head was subperiosteally elevated from the anterior aspect of the acromion preserving its attachment. It was subperiosteally elevated medially. The [facility] ligament was detached utilizing the a Neer elevator; this was subsequently excised. Hypertrophic bursa was noted as well as calcified arthrosis and significant subacromial tissue that was digitally mobilized. This bursa was also excised. There was a large osteophyte anterior aspect of the acromion. A cobra retractor was placed protecting the rotator cuff and an oscillating saw was utilized to perform acromioplasty. This significantly decompressed the subacromial space. The wound was then copiously irrigated and inspection of the rotator cuff revealed a central portion that was degenerated. The supraspinatus at its insertion was also noted to be torn with a fairly small tear. It was excised with good bleeding tissue and reapproximated with #1 Vicryl in interrupted figure-of-eight sutures. Full inspection of the remainder revealed no additional lesions. There was no evidence of residual impingement with full range of motion of the shoulder.  Next, the wound was copiously irrigated once again with no evidence of active bleeding. The raphe was repaired with #1 Vicryl interrupted figure-of-eight sutures to the acromion with excellent repair and no tension on the deltoid. Subcutaneous tissue reapproximated with 2-0 Vicryl simple sutures, the skin was reapproximated with 4-0 subcuticular PDS. The wound was reinforced with Steri-Strips. A sterile dressing was applied. The patient was extubated without difficulty and transported to the recovery room in satisfactory condition.  The patient tolerated the procedure well without complications. DD:  11/10/99 TD:  11/11/99 Job: [id number]

## 2010-07-02 NOTE — Assessment & Plan Note (Signed)
[facility]                           GASTROENTEROLOGY OFFICE NOTE   NAME:VERNONAundra, Chase                        MRN:          [id number]  DATE:09/15/2005                            DOB:          March 09, 1945    [name] is a 65 year old white female, patient of Dr. Juanda Chance, who is  being followed for IBS and elevated liver function tests, secondary to fatty  infiltration of her liver.   She comes to my office today, complaining of a week of diarrhea, cramping,  and low-grade fever, after being on a week of antibiotics per Dr. Ivery Quale for urinary tract infection.  She also is on daily Nexium,  Lexapro, and a variety of multivitamins, in addition to Toprol XL 50 mg a  day.   There are no sick family members at home.  She has had no other infectious  disease exposure.  In the past, has had negative colonoscopies in terms of  any significant problems, except for diverticulosis.   PHYSICAL EXAMINATION:  Exam today shows her to be a healthy-appearing, white  female, appearing her stated age, in no acute distress.  She does have a low-  grade temperature of 99 degrees.  Blood pressure is 100/62, pulse was 60 and  regular.  She is nontoxic and healthy-appearing.  Her abdominal exam showed  no hepatosplenomegaly, masses or tenderness.  Bowel sounds are normal.  She  had a stool specimen in a plastic bag, which appeared liquidy and non-  bloody.   ASSESSMENT:  I think [name] most likely has mild antibiotic-induced  colitis, per her history and exam.   RECOMMENDATIONS:  1.  Check CBC and sed rate.  2.  Start metronidazole 250 mg four times a day with daily probiotic      therapy.  3.  She is to call next week for a progress report to see whether she needs      further evaluation.  4.  I have ordered her liver function tests for review by Dr. Juanda Chance.                                   Vania Rea. Jarold Motto, MD, [facility], [place]   DRP/MedQ  DD:   09/15/2005  DT:  09/15/2005  Job #:  [id number]   cc:   Hedwig Morton. Juanda Chance, MD

## 2010-07-02 NOTE — Discharge Summary (Signed)
NAMEEVOLEHT, HOVATTER                 ACCOUNT NO.:  [id number]   MEDICAL RECORD NO.:  [id number]          PATIENT TYPE:  INP   LOCATION:  0359                         FACILITY:  [facility]   PHYSICIAN:  Rande Brunt. Gottsegen, M.D.DATE OF BIRTH:  08/07/1945   DATE OF ADMISSION:  03/23/2004  DATE OF DISCHARGE:  03/24/2004                                 DISCHARGE SUMMARY   The patient is a 65 year old, gravida 2, para 2 who was admitted to the  hospital with symptomatic uterine prolapse and rectocele for definitive  surgery.  On the day of admission, she was taken to the operating room, a  vaginal hysterectomy and posterior repair were performed without difficulty.  Postoperatively she did well and on the first postoperative day she was  ready for discharge.   DISCHARGE MEDICATIONS:  Home medicines plus Darvon 65 mg to use p.r.n. pain  relief.   FOLLOW UP:  She will return to the office in three weeks for followup.   DIET:  Regular.   ACTIVITY:  Ambulatory.   CONDITION ON DISCHARGE:  Improved.   Final pathology report is not in chart at time of dictation.   DISCHARGE DIAGNOSES:  Uterine prolapse, rectocele.   OPERATION:  Vaginal hysterectomy, posterior repair.      DLG/MEDQ  D:  04/15/2004  T:  04/15/2004  Job:  [id number]

## 2010-07-02 NOTE — Discharge Summary (Signed)
NAME:  Heidi Chase, Heidi Chase                           ACCOUNT NO.:  [id number]   MEDICAL RECORD NO.:  [id number]                   PATIENT TYPE:  OIB   LOCATION:  6532                                 FACILITY:  [facility]   PHYSICIAN:  Duke Salvia, [name]               DATE OF BIRTH:  19-Mar-1945   DATE OF ADMISSION:  02/17/2003  DATE OF DISCHARGE:  02/18/2003                                 DISCHARGE SUMMARY   DISCHARGE DIAGNOSES:  1. Supraventricular tachycardia.  2. Presyncope associated with supraventricular tachycardia.  3. Chest discomfort, chronic, questionable gastroesophageal reflux with a     negative Cardiolite in November 2004.  4. Malaise.   HISTORY OF PRESENT ILLNESS:  This is a 65 year old female with a two-year  history of recurrent abrupt onset of tachycardic palpitations almost always  associated postprandially.  They are associated with profound presyncope and  chest heaviness.  There is no syncope or shortness of breath.  There is  significant residual fatigue.  They are diuretic positive typically lasting  minutes and resolve on their own.  Because of this history of chest  discomfort, she underwent a Cardiolite in January 2004, which was normal.  She has not had an ultrasound.  Major complaint is that she has bilateral  hip pains which she has been told is related to her carrying children  around.  She dates it back, however, to her Lipitor.  The patient also has  nonclaudication calf pain, chronic headaches, anxiety and depression.  The  patient has been on Lexapro.  She was admitted for an SVT ablation.   HOSPITAL COURSE:  She went to the ET lab.  She had an ET study where she had  multiple atrial arrhythmias, two flutters with different atrial activation  sequence, P waves, irregular rhythm and atrial fibrillation.  The plan was  for medical therapy.  During procedure and post procedure, the patient  complained of left-sided neck pain.  She also stated that she had  chest pain  with deep inspiration.  A Stat chest x-ray, CAT scan of the head and neck  were both obtained.  CAT scan of the head was read as normal.  The CAT scan  of neck was read as negative vascular evaluation with negative CT of the  neck.  Chest x-ray showed patchy opacity in the left lower lung with  infiltrate versus atelectasis.  The following morning, the patient had no  further complaints of neck pain and was discharged to home in stable  condition on her previous medications.   DISCHARGE MEDICATIONS:  1. Multivitamin one daily.  2. Coated aspirin 81 mg daily.  3. Cardizem CD 180 mg daily.  4. Lexapro 10 nightly.  5. Calcium 1200 daily, one to two tablets every four to six hours as needed.   ACTIVITY:  No heavy lifting or strenuous activity x4 days.  No driving x2  days.  SPECIAL INSTRUCTIONS:  She was to call the office for any lump or drainage  in her groin.   FOLLOW UP:  Follow up with Dr. Duke Salvia on April 09, 2003, at 10  a.m.  Of note, recommendations were medical therapy with postprandial  episodes.  Would try and avoid beta-blockers and continue Cardizem.      Chinita Pester, C.R.N.P. [facility]                 Duke Salvia, [name]    DS/MEDQ  D:  02/18/2003  T:  02/18/2003  Job:  [id number]   cc:   Barry Dienes. [name], [name]  [address]  [place]  [place] [address]  Fax: [phone]

## 2010-07-15 ENCOUNTER — Encounter (INDEPENDENT_AMBULATORY_CARE_PROVIDER_SITE_OTHER): Payer: Medicare Other | Admitting: Obstetrics and Gynecology

## 2010-07-15 ENCOUNTER — Other Ambulatory Visit (HOSPITAL_COMMUNITY)
Admission: RE | Admit: 2010-07-15 | Discharge: 2010-07-15 | Disposition: A | Discharge status: Home / Self Care | Payer: Medicare Other | Source: Ambulatory Visit | Attending: Obstetrics and Gynecology | Admitting: Obstetrics and Gynecology

## 2010-07-15 ENCOUNTER — Other Ambulatory Visit: Payer: Self-pay | Admitting: Obstetrics and Gynecology

## 2010-07-15 DIAGNOSIS — N893 Dysplasia of vagina, unspecified: Secondary | ICD-10-CM

## 2010-07-15 DIAGNOSIS — N949 Unspecified condition associated with female genital organs and menstrual cycle: Secondary | ICD-10-CM

## 2010-07-15 DIAGNOSIS — Z124 Encounter for screening for malignant neoplasm of cervix: Secondary | ICD-10-CM | POA: Insufficient documentation

## 2010-07-15 DIAGNOSIS — N951 Menopausal and female climacteric states: Secondary | ICD-10-CM

## 2010-07-15 DIAGNOSIS — N952 Postmenopausal atrophic vaginitis: Secondary | ICD-10-CM

## 2010-07-16 ENCOUNTER — Other Ambulatory Visit: Payer: Self-pay | Admitting: Obstetrics and Gynecology

## 2010-07-16 DIAGNOSIS — Z1231 Encounter for screening mammogram for malignant neoplasm of breast: Secondary | ICD-10-CM

## 2010-07-20 ENCOUNTER — Ambulatory Visit
Admission: RE | Admit: 2010-07-20 | Discharge: 2010-07-20 | Disposition: A | Discharge status: Home / Self Care | Payer: Medicare Other | Source: Ambulatory Visit | Attending: Obstetrics and Gynecology | Admitting: Obstetrics and Gynecology

## 2010-07-20 DIAGNOSIS — Z1231 Encounter for screening mammogram for malignant neoplasm of breast: Secondary | ICD-10-CM

## 2010-08-04 ENCOUNTER — Encounter (INDEPENDENT_AMBULATORY_CARE_PROVIDER_SITE_OTHER): Payer: Medicare Other

## 2010-08-04 DIAGNOSIS — M899 Disorder of bone, unspecified: Secondary | ICD-10-CM

## 2011-03-04 ENCOUNTER — Ambulatory Visit (INDEPENDENT_AMBULATORY_CARE_PROVIDER_SITE_OTHER): Payer: Medicare Other | Admitting: Obstetrics and Gynecology

## 2011-03-04 ENCOUNTER — Other Ambulatory Visit: Payer: Self-pay | Admitting: Obstetrics and Gynecology

## 2011-03-04 DIAGNOSIS — R300 Dysuria: Secondary | ICD-10-CM

## 2011-03-04 DIAGNOSIS — N899 Noninflammatory disorder of vagina, unspecified: Secondary | ICD-10-CM

## 2011-03-04 DIAGNOSIS — N952 Postmenopausal atrophic vaginitis: Secondary | ICD-10-CM

## 2011-03-04 DIAGNOSIS — N898 Other specified noninflammatory disorders of vagina: Secondary | ICD-10-CM

## 2011-03-04 LAB — URINALYSIS W MICROSCOPIC + REFLEX CULTURE
Bilirubin Urine: NEGATIVE
Protein, ur: NEGATIVE mg/dL
Urobilinogen, UA: 0.2 mg/dL (ref 0.0–1.0)

## 2011-03-04 LAB — WET PREP FOR TRICH, YEAST, CLUE
Clue Cells Wet Prep HPF POC: NONE SEEN
Trich, Wet Prep: NONE SEEN
Yeast Wet Prep HPF POC: NONE SEEN

## 2011-03-04 MED ORDER — NITROFURANTOIN MONOHYD MACRO 100 MG PO CAPS: 100.0000 mg | ORAL_CAPSULE | Freq: Two times a day (BID) | ORAL | Status: AC

## 2011-03-04 MED ORDER — TERCONAZOLE 0.8 % VA CREA: 1.0000 | TOPICAL_CREAM | Freq: Every day | VAGINAL | Status: AC

## 2011-03-04 NOTE — Progress Notes (Signed)
Patient came to see me today with the following history. On Wednesday she had intercourse for the first time in one year. She has a history of atrophic vaginitis but is not currently on estrogen. She noticed immediately after finishing both dysuria and vaginal irritation. She did use a lubricant. Both the dysuria and vaginal irritation have increased over the past 48 hours. She is also having urinary frequency and urgency.  .Pelvic exam: External: vulvitis. BUS within normal limits. Vaginal exam: decreased estrogen effect. Obvious yeast infection present. Cervix  And uterus absent. Adnexa: Within normal limits. Rectovaginal exam: Within normal limits.  Kennon Portela present.  Urinalysis: Too numerous to count white blood cells. Wet prep negative.  Assessment: #1. Urinary tract infection #2. Yeast vaginitis #3. Atrophic vaginitis  Plan: Macrobid twice a day with food for 7 days. Followup urinalysis in one week. Patient has Monistat at home. She will use that for the yeast. If not successful we called in terconazole 3 cream. Discussed vaginal estrogen again. For the moment we will wait as a  Lubricant  as usually worked in the past

## 2011-03-11 ENCOUNTER — Ambulatory Visit: Payer: Medicare Other | Admitting: *Deleted

## 2011-03-11 DIAGNOSIS — N390 Urinary tract infection, site not specified: Secondary | ICD-10-CM

## 2011-03-11 LAB — URINALYSIS W MICROSCOPIC + REFLEX CULTURE
Bilirubin Urine: NEGATIVE
Glucose, UA: NEGATIVE mg/dL
Specific Gravity, Urine: 1.005 (ref 1.005–1.030)

## 2011-07-13 ENCOUNTER — Telehealth: Payer: Self-pay | Admitting: *Deleted

## 2011-07-13 MED ORDER — ESTRADIOL 0.1 MG/GM VA CREA: TOPICAL_CREAM | VAGINAL | Status: DC

## 2011-07-13 NOTE — Telephone Encounter (Signed)
Pt was told at last OV 02/22/11 to call if she would like to dry vaginal cream for her vaginal dryness. Pt said she feel raw inside, she is sexual active. Please advise

## 2011-07-13 NOTE — Telephone Encounter (Signed)
Estrace vaginal cream 1 g at bedtime in her vagina for 2 weeks and then  2-3 times a week.

## 2011-07-13 NOTE — Telephone Encounter (Signed)
Pt informed with the below note, rx sent to pharmacy. 

## 2011-07-13 NOTE — Telephone Encounter (Signed)
Addended by: Aura Camps on: 07/13/2011 04:33 PM   Modules accepted: Orders

## 2011-07-28 ENCOUNTER — Ambulatory Visit (INDEPENDENT_AMBULATORY_CARE_PROVIDER_SITE_OTHER): Payer: Medicare Other | Admitting: Obstetrics and Gynecology

## 2011-07-28 ENCOUNTER — Encounter: Payer: Self-pay | Admitting: Obstetrics and Gynecology

## 2011-07-28 VITALS — BP 120/74 | Ht 64.0 in | Wt 149.0 lb

## 2011-07-28 DIAGNOSIS — M899 Disorder of bone, unspecified: Secondary | ICD-10-CM

## 2011-07-28 DIAGNOSIS — M8580 Other specified disorders of bone density and structure, unspecified site: Secondary | ICD-10-CM

## 2011-07-28 DIAGNOSIS — N952 Postmenopausal atrophic vaginitis: Secondary | ICD-10-CM

## 2011-07-28 DIAGNOSIS — N390 Urinary tract infection, site not specified: Secondary | ICD-10-CM

## 2011-07-28 DIAGNOSIS — N890 Mild vaginal dysplasia: Secondary | ICD-10-CM | POA: Insufficient documentation

## 2011-07-28 DIAGNOSIS — N898 Other specified noninflammatory disorders of vagina: Secondary | ICD-10-CM

## 2011-07-28 DIAGNOSIS — Z8669 Personal history of other diseases of the nervous system and sense organs: Secondary | ICD-10-CM

## 2011-07-28 DIAGNOSIS — N893 Dysplasia of vagina, unspecified: Secondary | ICD-10-CM

## 2011-07-28 MED ORDER — ESTRADIOL 0.1 MG/GM VA CREA: TOPICAL_CREAM | VAGINAL | Status: DC

## 2011-07-28 NOTE — Progress Notes (Signed)
Patient came back to see me today for further followup. We saw her in January for a urinary tract infection. We treated her with antibiotics and followup urinalysis was normal and her symptoms went away. Several weeks ago she started to notice dyspareunia with vaginal dryness and vaginal burning. We treated her with Estrace cream and she is doing much better although her symptoms are not 100% better. She is only use the cream for 2 weeks. She is having no vaginal bleeding. She is having no pelvic pain. She is not having dysuria, frequency, or urgency. She will schedule her yearly mammogram. She had a bone density last year showing osteopenia without an elevated FRAX risk. She had lost bone mineral density since the previous one. She is taking calcium and vitamin D. She's had no fractures. She had a vaginal hysterectomy in 2006 and had a benign cervix at that time without history of abnormal Pap smears. She had a Pap smear after the hysterectomy in 2007 which showed VAIN-1. She has been watched expectantly and is now had 6 normal Pap smears.patient has a history of migraine headaches but has not had one in a long time. Since she started the estrogen cream she has had several headaches which responded to over-the-counter medication.  ROS: 12 system review done. Pertinent positives above. Other positives include atrial fibrillation.  HEENT: Within normal limits. Neck: No masses. Supraclavicular lymph nodes: Not enlarged. Breasts: Examined in both sitting and lying position. Symmetrical without skin changes or masses. Abdomen: Soft no masses guarding or rebound. No hernias. Pelvic: External within normal limits. BUS within normal limits. Vaginal examination shows fair estrogen effect, no cystocele enterocele or rectocele. Cervix and uterus absent. Adnexa within normal limits. Rectovaginal confirmatory. Extremities within normal limits.  Assessment: #1. Atrophic vaginitis #2. Urinary tract infection  #3.VAIN-1 #4. Osteopenia #5. Headaches probably related to Estrace cream.  Plan: Mammogram. Bone density next year. Continue Estrace cream. Patient will let me know if headaches persist. I suspect they will not.

## 2012-05-10 ENCOUNTER — Encounter: Payer: Self-pay | Admitting: *Deleted

## 2012-05-16 ENCOUNTER — Encounter: Payer: Self-pay | Admitting: Internal Medicine

## 2012-07-31 ENCOUNTER — Encounter: Payer: Self-pay | Admitting: Gynecology

## 2012-07-31 ENCOUNTER — Ambulatory Visit (INDEPENDENT_AMBULATORY_CARE_PROVIDER_SITE_OTHER): Payer: Medicare Other | Admitting: Gynecology

## 2012-07-31 VITALS — BP 128/84 | Ht 63.75 in | Wt 145.0 lb

## 2012-07-31 DIAGNOSIS — M8580 Other specified disorders of bone density and structure, unspecified site: Secondary | ICD-10-CM

## 2012-07-31 DIAGNOSIS — N949 Unspecified condition associated with female genital organs and menstrual cycle: Secondary | ICD-10-CM

## 2012-07-31 DIAGNOSIS — R102 Pelvic and perineal pain: Secondary | ICD-10-CM

## 2012-07-31 DIAGNOSIS — M899 Disorder of bone, unspecified: Secondary | ICD-10-CM

## 2012-07-31 DIAGNOSIS — Z780 Asymptomatic menopausal state: Secondary | ICD-10-CM

## 2012-07-31 DIAGNOSIS — N952 Postmenopausal atrophic vaginitis: Secondary | ICD-10-CM

## 2012-07-31 DIAGNOSIS — Z1159 Encounter for screening for other viral diseases: Secondary | ICD-10-CM

## 2012-07-31 DIAGNOSIS — N959 Unspecified menopausal and perimenopausal disorder: Secondary | ICD-10-CM

## 2012-07-31 DIAGNOSIS — IMO0002 Reserved for concepts with insufficient information to code with codable children: Secondary | ICD-10-CM

## 2012-07-31 MED ORDER — NONFORMULARY OR COMPOUNDED ITEM: Status: DC

## 2012-07-31 NOTE — Progress Notes (Signed)
Heidi Chase [birth year] [id number]   History:    67 y.o.  In the office today complaining of vaginal dryness dyspareunia and low Donald pain zonule. She denies any change in bowel movement habits or any urinary dysfunction.patient had a bone density study in 2012 showing osteopenia without an elevated Frax risk. She is taking her calcium and vitamin D and has had no bone fractures. She had a vaginal hysterectomy with posterior repair secondary to prolapse in 2006 and had a benign cervix at that time without history of abnormal Pap smears. 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  Patient has been followed by Dr. Jarold Motto her primary physician. Patient with past history of atrial fibrillation. Patient is overdue for mammogram as well as her bone density study. Patient is currently not using any vaginal estrogen for her atrophy. Patient had colonoscopy in 2012 benign polyps were removed. The patient stated she noted some blood in her stool back in May and she has had history of H. Pylori in the past and treated. She feels bloated after she periods she is in the process of making a followup appointment with her gastroenterologist Dr. Juanda Chance.   Past medical history,surgical history, family history and social history were all reviewed and documented in the EPIC chart.  Gynecologic History No LMP recorded. Patient has had a hysterectomy. Contraception: post menopausal status Last Pap: 2012. Results were: normal Last mammogram: 2012. Results were: normal  Obstetric History OB History   Grav Para Term Preterm Abortions TAB SAB Ect Mult Living   2 2 2       2      # Outc Date GA Lbr Len/2nd Wgt Sex Del Anes PTL Lv   1 TRM            2 TRM                ROS: A ROS was performed and pertinent positives and negatives are included in the history.  GENERAL: No fevers or chills. HEENT: No change in vision, no  earache, sore throat or sinus congestion. NECK: No pain or stiffness. CARDIOVASCULAR: No chest pain or pressure. No palpitations. PULMONARY: No shortness of breath, cough or wheeze. GASTROINTESTINAL: No abdominal pain, nausea, vomiting or diarrhea, melena or bright red blood per rectum. GENITOURINARY: complaining of dyspareunia, vaginal dryness and irritation MUSCULOSKELETAL: No joint or muscle pain, no back pain, no recent trauma. DERMATOLOGIC: No rash, no itching, no lesions. ENDOCRINE: No polyuria, polydipsia, no heat or cold intolerance. No recent change in weight. HEMATOLOGICAL: No anemia or easy bruising or bleeding. NEUROLOGIC: No headache, seizures, numbness, tingling or weakness. PSYCHIATRIC: No depression, no loss of interest in normal activity or change in sleep pattern.     Exam: chaperone present  BP 128/84  Ht 5' 3.75" (1.619 m)  Wt 145 lb (65.772 kg)  BMI 25.09 kg/m2  Body mass index is 25.09 kg/(m^2).  General appearance : Well developed well nourished female. No acute distress HEENT: Neck supple, trachea midline, no carotid bruits, no thyroidmegaly Lungs: Clear to auscultation, no rhonchi or wheezes, or rib retractions  Heart: Regular rate and rhythm, no murmurs or gallops Breast:Examined in sitting and supine position were symmetrical in appearance, no palpable masses or tenderness,  no skin retraction, no nipple inversion, no nipple discharge, no skin discoloration, no axillary or supraclavicular lymphadenopathy Abdomen: no palpable masses or tenderness, no rebound or guarding  Extremities: no edema or skin discoloration or tenderness  Pelvic:  Bartholin, Urethra, Skene Glands: Within normal limits             Vagina: No gross lesions or discharge  Cervix:absent  Uterus Absence  Adnexa  Without masses or tenderness  Anus and perineum  normal   Rectovaginal  normal sphincter tone without palpated masses or tenderness             Hemoccult Card provided      Assessment/Plan:  67 y.o. female with vaginal dryness, irritation, and dyspareunia. Patient will be started on estradiol 0.02% 1 mL prefilled applicators to apply intravaginally twice a week. Patient was counseled as to the risks benefits and pros and cons of hormone replacement therapy and slight risk of breast cancer. Her risks are low because this is only twice a week application at a low dose and vaginally. She fully understands the risk and accepts.  New CDC guidelines is recommending patients be tested once in her lifetime for hepatitis C antibody who were born between [birth year] through 1965. This was discussed with the patient today and has agreed to be tested today.  Patient will schedule her bone density study in the next few weeks. She will also need to schedule abdominal ultrasound here in the office. We discussed importance of monthly self breast examination. She was given a requisition schedule a mammogram as well. She was given a requisition as well as instructions on the shingles vaccine. She was reminded cement to the office in nuchal cords for testing. No Pap smear done today the new guidelines were discussed. We discussed importance of calcium and vitamin D in regular exercise for osteoporosis prevention as well. Hemoccult testing was done today in the office. She was reminded to follow up with Dr. Dickie La because of her episode of hematochezia in May and also her abdominal bloating as well as her past history of H. Pylori.  Ok Edwards MD, 5:38 PM 07/31/2012

## 2012-07-31 NOTE — Patient Instructions (Addendum)
Shingles Vaccine What You Need to Know WHAT IS SHINGLES?  Shingles is a painful skin rash, often with blisters. It is also called Herpes Zoster or just Zoster.  A shingles rash usually appears on one side of the face or body and lasts from 2 to 4 weeks. Its main symptom is pain, which can be quite severe. Other symptoms of shingles can include fever, headache, chills, and upset stomach. Very rarely, a shingles infection can lead to pneumonia, hearing problems, blindness, brain inflammation (encephalitis), or death.  For about 1 person in 5, severe pain can continue even after the rash clears up. This is called post-herpetic neuralgia.  Shingles is caused by the Varicella Zoster virus. This is the same virus that causes chickenpox. Only someone who has had a case of chickenpox or rarely, has gotten chickenpox vaccine, can get shingles. The virus stays in your body. It can reappear many years later to cause a case of shingles.  You cannot catch shingles from another person with shingles. However, a person who has never had chickenpox (or chickenpox vaccine) could get chickenpox from someone with shingles. This is not very common.  Shingles is far more common in people 50 and older than in younger people. It is also more common in people whose immune systems are weakened because of a disease such as cancer or drugs such as steroids or chemotherapy.  At least 1 million people get shingles per year in the [place]. SHINGLES VACCINE  A vaccine for shingles was licensed in 2006. In clinical trials, the vaccine reduced the risk of shingles by 50%. It can also reduce the pain in people who still get shingles after being vaccinated.  A single dose of shingles vaccine is recommended for adults 60 years of age and older. SOME PEOPLE SHOULD NOT GET SHINGLES VACCINE OR SHOULD WAIT A person should not get shingles vaccine if he or she:  Has ever had a life-threatening allergic reaction to gelatin, the  antibiotic neomycin, or any other component of shingles vaccine. Tell your caregiver if you have any severe allergies.  Has a weakened immune system because of current:  AIDS or another disease that affects the immune system.  Treatment with drugs that affect the immune system, such as prolonged use of high-dose steroids.  Cancer treatment, such as radiation or chemotherapy.  Cancer affecting the bone marrow or lymphatic system, such as leukemia or lymphoma.  Is pregnant, or might be pregnant. Women should not become pregnant until at least 4 weeks after getting shingles vaccine. Someone with a minor illness, such as a cold, may be vaccinated. Anyone with a moderate or severe acute illness should usually wait until he or she recovers before getting the vaccine. This includes anyone with a temperature of 101.3 F (38 C) or higher. WHAT ARE THE RISKS FROM SHINGLES VACCINE?  A vaccine, like any medicine, could possibly cause serious problems, such as severe allergic reactions. However, the risk of a vaccine causing serious harm, or death, is extremely small.  No serious problems have been identified with shingles vaccine. Mild Problems  Redness, soreness, swelling, or itching at the site of the injection (about 1 person in 3).  Headache (about 1 person in 70). Like all vaccines, shingles vaccine is being closely monitored for unusual or severe problems. WHAT IF THERE IS A MODERATE OR SEVERE REACTION? What should I look for? Any unusual condition, such as a severe allergic reaction or a high fever. If a severe allergic reaction   occurred, it would be within a few minutes to an hour after the shot. Signs of a serious allergic reaction can include difficulty breathing, weakness, hoarseness or wheezing, a fast heartbeat, hives, dizziness, paleness, or swelling of the throat. What should I do?  Call your caregiver, or get the person to a caregiver right away.  Tell the caregiver what  happened, the date and time it happened, and when the vaccination was given.  Ask the caregiver to report the reaction by filing a Vaccine Adverse Event Reporting System (VAERS) form. Or, you can file this report through the VAERS web site at www.vaers.LAgents.no or by calling 1-432-823-1865. VAERS does not provide medical advice. HOW CAN I LEARN MORE?  Ask your caregiver. He or she can give you the vaccine package insert or suggest other sources of information.  Contact the Centers for Disease Control and Prevention (CDC):  Call 214-297-1116 (1-800-CDC-INFO).  Visit the CDC website at PicCapture.uy CDC Shingles Vaccine VIS (11/20/07) Document Released: 11/28/2005 Document Revised: 04/25/2011 Document Reviewed: 11/20/2007 ExitCare Patient Information 2014 [place], [place].   Take one caltrate plus or [place], or Citracal daily for bone health

## 2012-08-01 ENCOUNTER — Encounter: Payer: Self-pay | Admitting: Obstetrics and Gynecology

## 2012-08-01 LAB — HEPATITIS C ANTIBODY: HCV Ab: NEGATIVE

## 2012-08-08 ENCOUNTER — Encounter: Payer: Self-pay | Admitting: Obstetrics and Gynecology

## 2012-08-08 ENCOUNTER — Encounter: Payer: Self-pay | Admitting: Cardiology

## 2012-08-14 ENCOUNTER — Encounter: Payer: Self-pay | Admitting: Cardiology

## 2012-08-22 ENCOUNTER — Other Ambulatory Visit: Payer: Self-pay | Admitting: Anesthesiology

## 2012-08-22 DIAGNOSIS — Z1211 Encounter for screening for malignant neoplasm of colon: Secondary | ICD-10-CM

## 2012-09-10 ENCOUNTER — Other Ambulatory Visit: Payer: Self-pay

## 2012-10-12 ENCOUNTER — Ambulatory Visit (HOSPITAL_COMMUNITY)
Admission: RE | Admit: 2012-10-12 | Discharge: 2012-10-12 | Disposition: A | Discharge status: Home / Self Care | Payer: Medicare Other | Source: Ambulatory Visit | Attending: Gynecology | Admitting: Gynecology

## 2012-10-12 ENCOUNTER — Ambulatory Visit (INDEPENDENT_AMBULATORY_CARE_PROVIDER_SITE_OTHER): Payer: Medicare Other | Admitting: Internal Medicine

## 2012-10-12 ENCOUNTER — Encounter: Payer: Self-pay | Admitting: Internal Medicine

## 2012-10-12 ENCOUNTER — Other Ambulatory Visit (INDEPENDENT_AMBULATORY_CARE_PROVIDER_SITE_OTHER): Payer: Medicare Other

## 2012-10-12 ENCOUNTER — Other Ambulatory Visit: Payer: Self-pay | Admitting: Gynecology

## 2012-10-12 VITALS — BP 128/80 | HR 68 | Ht 63.75 in | Wt 144.6 lb

## 2012-10-12 DIAGNOSIS — Z13820 Encounter for screening for osteoporosis: Secondary | ICD-10-CM | POA: Insufficient documentation

## 2012-10-12 DIAGNOSIS — Z780 Asymptomatic menopausal state: Secondary | ICD-10-CM | POA: Insufficient documentation

## 2012-10-12 DIAGNOSIS — R109 Unspecified abdominal pain: Secondary | ICD-10-CM

## 2012-10-12 DIAGNOSIS — K7581 Nonalcoholic steatohepatitis (NASH): Secondary | ICD-10-CM

## 2012-10-12 DIAGNOSIS — M8580 Other specified disorders of bone density and structure, unspecified site: Secondary | ICD-10-CM

## 2012-10-12 DIAGNOSIS — R195 Other fecal abnormalities: Secondary | ICD-10-CM

## 2012-10-12 DIAGNOSIS — Z1231 Encounter for screening mammogram for malignant neoplasm of breast: Secondary | ICD-10-CM

## 2012-10-12 DIAGNOSIS — N959 Unspecified menopausal and perimenopausal disorder: Secondary | ICD-10-CM

## 2012-10-12 DIAGNOSIS — K7689 Other specified diseases of liver: Secondary | ICD-10-CM

## 2012-10-12 LAB — IBC PANEL
Iron: 69 ug/dL (ref 42–145)
Transferrin: 351.8 mg/dL (ref 212.0–360.0)

## 2012-10-12 MED ORDER — ESOMEPRAZOLE MAGNESIUM 40 MG PO CPDR: 40.0000 mg | DELAYED_RELEASE_CAPSULE | Freq: Every day | ORAL | Status: DC

## 2012-10-12 MED ORDER — DIAZEPAM 2 MG PO TABS: 2.0000 mg | ORAL_TABLET | Freq: Two times a day (BID) | ORAL | Status: DC | PRN

## 2012-10-12 MED ORDER — CIPROFLOXACIN HCL 250 MG PO TABS: 250.0000 mg | ORAL_TABLET | Freq: Two times a day (BID) | ORAL | Status: DC

## 2012-10-12 NOTE — Patient Instructions (Addendum)
We have sent the following medications to your pharmacy for you to pick up at your convenience: Nexium Cipro 250 twice daily x 5 days  Your physician has requested that you go to the basement for the following lab work before leaving today: IBC, BUN, Creatinine  You have been scheduled for an MRI at [facility] on ___________. Your appointment time is __________. Please arrive 15 minutes prior to your appointment time for registration purposes. There is no prep for this test. However, if you have any metal in your body, have a pacemaker or defibrillator, please be sure to let your ordering physician know. This test typically takes 45 minutes to 1 hour to complete.  Dr Dossie Arbour

## 2012-10-12 NOTE — Progress Notes (Signed)
Heidi Chase 1945/09/05 MRN [id number]        History of Present Illness:  This is a 67 year old white female with  abdominal pain and positive home stool test for occult blood which was done in July 2014 by Dr [name]. She has seen low-volume rectal bleeding, stool mixed with stool on several occasions. She had an acute gastroenteritis while vacationing at the beach in May 2014 , which has now resolved.. She is complaining of burning on urination but denies any change in bowel habits. She does not take any laxatives. She has a positive family history of colon cancer in her father and a prior colonoscopy in 1996, 2005, 2009 when she hada tubular adenomax2 removed . Last colonoscopy in April 2012. Upper endoscopy in 2005 was positive for H. pylori which was treated. Subsequent endoscopy in April 2009 and 2012 showed mild esophageal stricture which was dilated with 48 [place] Maloney dilator. Upper abdominal ultrasound in February 2009 showed the abnormal liver texture and normal size spleen  5.6 cm. She has a known steatohepatitis confirmed on the liver biopsy from 2001. Trichrome stains were negative for fibrosis. Her last hemoglobin was 14.6 hematocrit 43.7. She has been taking Zantac 150 mg 2 times a day with breakthrough symptoms of heartburn indigestion and bloating. She used to be on Nexium but had  to stop it because of the cost. She would like to resume it.   Past Medical History  Diagnosis Date  . Hyperlipidemia   . Fatty liver   . SVT (supraventricular tachycardia)   . Uterine hyperplasia   . Positive H. pylori test 05/2003    positive x 2  . Diabetes mellitus   . Atrial fibrillation   . Elevated LFTs   . IBS (irritable bowel syndrome)   . GERD (gastroesophageal reflux disease)   . Diverticulosis   . Anxiety   . VAIN I (vaginal intraepithelial neoplasia grade I)    Past Surgical History  Procedure Laterality Date  . Rotator cuff repair  10/2000    left  . Dilation and  curettage, diagnostic / therapeutic    . Tonsillectomy    . Laparoscopy  1974  . Thumb surgery  10/2000  . Breast biopsy      left  . Pr denies breast biopsy    . Partial hysterectomy - per pt    . Repair rectocele    . Pelvic laparoscopy    . Vaginal hysterectomy  03/23/04  . Colposcopy  2007    reports that she has never smoked. She has never used smokeless tobacco. She reports that she does not drink alcohol or use illicit drugs. family history includes Cancer in her sister; Colon cancer in her father and mother; Diabetes in her mother and sister; Heart attack in her maternal grandfather, maternal grandmother, mother, and paternal grandfather; Heart disease in her father, mother, and sister; Hypertension in her father, mother, and sister; Uterine cancer in her sister. No Known Allergies      Review of Systems: Occasional dysphagia. Denies solid weight loss nausea vomiting  The remainder of the 10 point ROS is negative except as outlined in H&P   Physical Exam: General appearance  Well developed, in no distress. Eyes- non icteric. HEENT nontraumatic, normocephalic. Mouth no lesions, tongue papillated, no cheilosis. Neck supple without adenopathy, thyroid not enlarged, no carotid bruits, no JVD. Lungs Clear to auscultation bilaterally. Cor normal S1, normal S2, regular rhythm, no murmur,  quiet precordium. Abdomen: Soft with tenderness in  left lower quadrant and right lower quadrant without palpable mass. There is no ascites. Liver edge at costal margin. Mild epigastric discomfort Rectal: Soft Hemoccult negative stool Extremities no pedal edema. Skin no lesions. Neurological alert and oriented x 3. Psychological normal mood and affect.  Assessment and Plan:  67 year old white female with the positive occult blood on the home test ,not reproduced  on today's exam. Hemoglobin is normal. She is up-to-date on her colonoscopy. She is at higher risk for colon cancer based on her  family history . We will proceed with MRI of the abdomen and pelvis rather than repeating a colonoscopy at this time. Specifically  We would like to look at the liver to rule out cirrhosis and portal hypertension which could cause of GI blood loss ,also look at the size of the spleen and  pelvic organs.to r/o ovarian cancer.  Abnormal liver function tests and history of steatohepatitis without significant fibrosis as of liver biopsy 2001. MRI of the abdomen including labor will rule out any possibility of a hepatoma or space occupying lesion of the liver  Dyspepsia. We will switch her from ranitidine to Nexium 40 mg daily. I have given her samples. Depending on the results of the MRI we may decide to do a small bowel capsule endoscopy to look for AVM's  in the small bowel  Urinary tract infection. Last urinalysis in July 2014 was normal but she's complaining of burning. We will give her Cipro 250 twice a day for 5 days   10/12/2012 Lina Sar

## 2012-10-16 ENCOUNTER — Other Ambulatory Visit: Payer: Self-pay | Admitting: *Deleted

## 2012-10-16 DIAGNOSIS — D509 Iron deficiency anemia, unspecified: Secondary | ICD-10-CM

## 2012-10-16 MED ORDER — FERROUS SULFATE 325 (65 FE) MG PO TABS: ORAL_TABLET | ORAL | Status: DC

## 2012-10-22 ENCOUNTER — Ambulatory Visit (HOSPITAL_COMMUNITY): Admission: RE | Admit: 2012-10-22 | Payer: Medicare Other | Source: Ambulatory Visit

## 2012-10-22 ENCOUNTER — Ambulatory Visit (HOSPITAL_COMMUNITY)
Admission: RE | Admit: 2012-10-22 | Discharge: 2012-10-22 | Disposition: A | Discharge status: Home / Self Care | Payer: Medicare Other | Source: Ambulatory Visit | Attending: Internal Medicine | Admitting: Internal Medicine

## 2012-10-22 DIAGNOSIS — R160 Hepatomegaly, not elsewhere classified: Secondary | ICD-10-CM | POA: Insufficient documentation

## 2012-10-22 DIAGNOSIS — R109 Unspecified abdominal pain: Secondary | ICD-10-CM

## 2012-10-22 DIAGNOSIS — R195 Other fecal abnormalities: Secondary | ICD-10-CM

## 2012-10-22 DIAGNOSIS — K7689 Other specified diseases of liver: Secondary | ICD-10-CM | POA: Insufficient documentation

## 2012-10-22 DIAGNOSIS — K7581 Nonalcoholic steatohepatitis (NASH): Secondary | ICD-10-CM

## 2012-10-23 ENCOUNTER — Other Ambulatory Visit: Payer: Self-pay | Admitting: *Deleted

## 2012-10-23 DIAGNOSIS — R109 Unspecified abdominal pain: Secondary | ICD-10-CM

## 2012-10-23 DIAGNOSIS — R160 Hepatomegaly, not elsewhere classified: Secondary | ICD-10-CM

## 2012-10-23 DIAGNOSIS — K625 Hemorrhage of anus and rectum: Secondary | ICD-10-CM

## 2012-10-24 ENCOUNTER — Encounter: Payer: Self-pay | Admitting: Internal Medicine

## 2012-10-31 ENCOUNTER — Other Ambulatory Visit (INDEPENDENT_AMBULATORY_CARE_PROVIDER_SITE_OTHER): Payer: Medicare Other

## 2012-10-31 DIAGNOSIS — R109 Unspecified abdominal pain: Secondary | ICD-10-CM

## 2012-10-31 LAB — HEPATIC FUNCTION PANEL
ALT: 59 U/L — ABNORMAL HIGH (ref 0–35)
AST: 42 U/L — ABNORMAL HIGH (ref 0–37)
Albumin: 4.3 g/dL (ref 3.5–5.2)
Total Protein: 7.7 g/dL (ref 6.0–8.3)

## 2012-10-31 LAB — FERRITIN: Ferritin: 49.7 ng/mL (ref 10.0–291.0)

## 2012-11-07 ENCOUNTER — Ambulatory Visit (AMBULATORY_SURGERY_CENTER): Payer: Medicare Other | Admitting: *Deleted

## 2012-11-07 VITALS — Ht 64.0 in | Wt 146.2 lb

## 2012-11-07 DIAGNOSIS — R109 Unspecified abdominal pain: Secondary | ICD-10-CM

## 2012-11-07 NOTE — Progress Notes (Signed)
No allergies to eggs or soy. No problems with anesthesia.  

## 2012-11-12 ENCOUNTER — Encounter: Payer: Self-pay | Admitting: Internal Medicine

## 2012-11-14 ENCOUNTER — Encounter: Payer: Self-pay | Admitting: Internal Medicine

## 2012-11-14 ENCOUNTER — Ambulatory Visit (AMBULATORY_SURGERY_CENTER): Payer: Medicare Other | Admitting: Internal Medicine

## 2012-11-14 VITALS — BP 136/86 | HR 60 | Temp 97.8°F | Resp 19 | Ht 64.0 in | Wt 146.0 lb

## 2012-11-14 DIAGNOSIS — K2990 Gastroduodenitis, unspecified, without bleeding: Secondary | ICD-10-CM

## 2012-11-14 DIAGNOSIS — K2970 Gastritis, unspecified, without bleeding: Secondary | ICD-10-CM

## 2012-11-14 DIAGNOSIS — K209 Esophagitis, unspecified without bleeding: Secondary | ICD-10-CM

## 2012-11-14 DIAGNOSIS — K2960 Other gastritis without bleeding: Secondary | ICD-10-CM

## 2012-11-14 DIAGNOSIS — R195 Other fecal abnormalities: Secondary | ICD-10-CM

## 2012-11-14 DIAGNOSIS — R109 Unspecified abdominal pain: Secondary | ICD-10-CM

## 2012-11-14 HISTORY — PX: ESOPHAGOGASTRODUODENOSCOPY: SHX1529

## 2012-11-14 LAB — GLUCOSE, CAPILLARY
Glucose-Capillary: 109 mg/dL — ABNORMAL HIGH (ref 70–99)
Glucose-Capillary: 90 mg/dL (ref 70–99)

## 2012-11-14 MED ORDER — SODIUM CHLORIDE 0.9 % IV SOLN: 500.0000 mL | INTRAVENOUS | Status: DC

## 2012-11-14 NOTE — Progress Notes (Signed)
Patient did not experience any of the following events: a burn prior to discharge; a fall within the facility; wrong site/side/patient/procedure/implant event; or a hospital transfer or hospital admission upon discharge from the facility. (G8907) Patient did not have preoperative order for IV antibiotic SSI prophylaxis. ([id number])  

## 2012-11-14 NOTE — Progress Notes (Signed)
Called to room to assist during endoscopic procedure.  Patient ID and intended procedure confirmed with present staff. Received instructions for my participation in the procedure from the performing physician.  

## 2012-11-14 NOTE — Patient Instructions (Signed)
YOU HAD AN ENDOSCOPIC PROCEDURE TODAY AT THE [facility]: Refer to the procedure report that was given to you for any specific questions about what was found during the examination.  If the procedure report does not answer your questions, please call your gastroenterologist to clarify.  If you requested that your care partner not be given the details of your procedure findings, then the procedure report has been included in a sealed envelope for you to review at your convenience later.  YOU SHOULD EXPECT: Some feelings of bloating in the abdomen. Passage of more gas than usual.  Walking can help get rid of the air that was put into your GI tract during the procedure and reduce the bloating. If you had a lower endoscopy (such as a colonoscopy or flexible sigmoidoscopy) you may notice spotting of blood in your stool or on the toilet paper. If you underwent a bowel prep for your procedure, then you may not have a normal bowel movement for a few days.  DIET: Your first meal following the procedure should be a light meal and then it is ok to progress to your normal diet.  A half-sandwich or bowl of soup is an example of a good first meal.  Heavy or fried foods are harder to digest and may make you feel nauseous or bloated.  Likewise meals heavy in dairy and vegetables can cause extra gas to form and this can also increase the bloating.  Drink plenty of fluids but you should avoid alcoholic beverages for 24 hours.  ACTIVITY: Your care partner should take you home directly after the procedure.  You should plan to take it easy, moving slowly for the rest of the day.  You can resume normal activity the day after the procedure however you should NOT DRIVE or use heavy machinery for 24 hours (because of the sedation medicines used during the test).    SYMPTOMS TO REPORT IMMEDIATELY: A gastroenterologist can be reached at any hour.  During normal business hours, 8:30 AM to 5:00 PM Monday through Friday,  call [phone].  After hours and on weekends, please call the GI answering service at [phone] who will take a message and have the physician on call contact you.  Following upper endoscopy (EGD)  Vomiting of blood or coffee ground material  New chest pain or pain under the shoulder blades  Painful or persistently difficult swallowing  New shortness of breath  Fever of 100F or higher  Black, tarry-looking stools  FOLLOW UP: If any biopsies were taken you will be contacted by phone or by letter within the next 1-3 weeks.  Call your gastroenterologist if you have not heard about the biopsies in 3 weeks.  Our staff will call the home number listed on your records the next business day following your procedure to check on you and address any questions or concerns that you may have at that time regarding the information given to you following your procedure. This is a courtesy call and so if there is no answer at the home number and we have not heard from you through the emergency physician on call, we will assume that you have returned to your regular daily activities without incident.  SIGNATURES/CONFIDENTIALITY: You and/or your care partner have signed paperwork which will be entered into your electronic medical record.  These signatures attest to the fact that that the information above on your After Visit Summary has been reviewed and is understood.  Full responsibility of   the confidentiality of this discharge information lies with you and/or your care-partner. 

## 2012-11-14 NOTE — Op Note (Signed)
[facility] [address] [place], [address]   ENDOSCOPY PROCEDURE REPORT  PATIENT: Heidi Chase, Heidi Chase  MR#: [id number] BIRTHDATE: 07-15-1945 , 67  yrs. old GENDER: Female ENDOSCOPIST: Hart Carwin, MD REFERRED BY:  Jarome Matin, [name] 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  The LB [id number] endoscope was introduced through the mouth and advanced to the second portion of the duodenum. Without limitations.  The instrument was slowly withdrawn as the mucosa was fully examined.      Esophagus: Proximal mid and distal esophageal mucosa appeared normal. Z line was irregular were short tongues of gastric mucosa extending into the esophagus. There was no esophageal stricture or hiatal hernia. Multiple biopsies were taken from GE junctionThere were no esophageal varices. Stomach : Gastric folds appeared normal. There was diffuse antral gastritis. 2 superficial erosions. No bleeding. There were no gastric varices on retroflexion. Biopsies were taken from gastric antrum to rule out recurrent H. pyloric. Gastric outlet was normal Duodenum:  duodenal bulb and descending duodenum was normal[ The scope was then withdrawn from the patient and the procedure completed.  COMPLICATIONS: There were no complications. ENDOSCOPIC IMPRESSION: 1. irregular GE junction status post biopsies to rule out Barrett's esophagus 2. Mild antral gastritis. Status post biopsies to rule out recurrent H. pyloric 3. nothing to account for heme positive stool 4. No  esophageal varices RECOMMENDATIONS: 1.  Await pathology results 2.  Continue PPI 3.  Anti-reflux regimen to be follow  REPEAT EXAM: for EGD pending biopsy results.  eSigned:  Hart Carwin, MD 11/14/2012 3:57 PM   CC:  PATIENT NAME:  [name], [name] MR#: [id number]

## 2012-11-15 ENCOUNTER — Telehealth: Payer: Self-pay | Admitting: *Deleted

## 2012-11-15 NOTE — Telephone Encounter (Signed)
Number identifier, left message, follow-up  

## 2012-11-20 ENCOUNTER — Encounter: Payer: Self-pay | Admitting: Internal Medicine

## 2012-12-20 ENCOUNTER — Other Ambulatory Visit: Payer: Self-pay

## 2013-01-08 ENCOUNTER — Telehealth: Payer: Self-pay | Admitting: *Deleted

## 2013-01-08 NOTE — Telephone Encounter (Signed)
Message copied by Daphine Deutscher on Tue Jan 08, 2013 11:01 AM ------      Message from: Daphine Deutscher      Created: Tue Oct 16, 2012  9:45 AM       Call and remind due for IBC panel for DB. Labi EPIC ------

## 2013-01-08 NOTE — Telephone Encounter (Signed)
Spoke with patient and she will have labs done

## 2013-01-16 ENCOUNTER — Other Ambulatory Visit (INDEPENDENT_AMBULATORY_CARE_PROVIDER_SITE_OTHER): Payer: Medicare Other

## 2013-01-16 DIAGNOSIS — D509 Iron deficiency anemia, unspecified: Secondary | ICD-10-CM

## 2013-01-16 LAB — IBC PANEL
Saturation Ratios: 24.2 % (ref 20.0–50.0)
Transferrin: 324.3 mg/dL (ref 212.0–360.0)

## 2013-06-25 ENCOUNTER — Other Ambulatory Visit (HOSPITAL_COMMUNITY)
Admission: RE | Admit: 2013-06-25 | Discharge: 2013-06-25 | Disposition: A | Discharge status: Home / Self Care | Payer: Medicare Other | Source: Ambulatory Visit | Attending: Gynecology | Admitting: Gynecology

## 2013-06-25 ENCOUNTER — Ambulatory Visit (INDEPENDENT_AMBULATORY_CARE_PROVIDER_SITE_OTHER): Payer: Medicare Other | Admitting: Gynecology

## 2013-06-25 ENCOUNTER — Encounter: Payer: Self-pay | Admitting: Gynecology

## 2013-06-25 VITALS — BP 120/78 | Ht 63.75 in | Wt 136.0 lb

## 2013-06-25 DIAGNOSIS — M949 Disorder of cartilage, unspecified: Secondary | ICD-10-CM

## 2013-06-25 DIAGNOSIS — M899 Disorder of bone, unspecified: Secondary | ICD-10-CM

## 2013-06-25 DIAGNOSIS — N951 Menopausal and female climacteric states: Secondary | ICD-10-CM

## 2013-06-25 DIAGNOSIS — Z1272 Encounter for screening for malignant neoplasm of vagina: Secondary | ICD-10-CM

## 2013-06-25 DIAGNOSIS — Z87411 Personal history of vaginal dysplasia: Secondary | ICD-10-CM

## 2013-06-25 DIAGNOSIS — R87619 Unspecified abnormal cytological findings in specimens from cervix uteri: Secondary | ICD-10-CM | POA: Insufficient documentation

## 2013-06-25 DIAGNOSIS — Z1151 Encounter for screening for human papillomavirus (HPV): Secondary | ICD-10-CM | POA: Insufficient documentation

## 2013-06-25 DIAGNOSIS — Z79890 Hormone replacement therapy (postmenopausal): Secondary | ICD-10-CM

## 2013-06-25 DIAGNOSIS — Z780 Asymptomatic menopausal state: Secondary | ICD-10-CM

## 2013-06-25 DIAGNOSIS — N952 Postmenopausal atrophic vaginitis: Secondary | ICD-10-CM

## 2013-06-25 DIAGNOSIS — Z124 Encounter for screening for malignant neoplasm of cervix: Secondary | ICD-10-CM | POA: Insufficient documentation

## 2013-06-25 DIAGNOSIS — M8580 Other specified disorders of bone density and structure, unspecified site: Secondary | ICD-10-CM

## 2013-06-25 MED ORDER — NONFORMULARY OR COMPOUNDED ITEM: Status: DC

## 2013-06-25 NOTE — Progress Notes (Signed)
Heidi Chase 1945/08/14 [id number]   History:    68 y.o.  presented to the office for GYN exam. She is complaining on and off of left lower abdominal discomfort. She does have history of diverticulosis. She had a normal colonoscopy reported in 2014 although she has had history of colon polyps in the past. Her last bone density demonstrated that her lowest T. score was at the left femoral neck with a value of -1.3. FRAX assessment analysis was normal. Patient with no prior history of abnormal Pap smears. She is taking her calcium and vitamin D.   She had a vaginal hysterectomy with posterior repair secondary to prolapse in 2006 and had a benign cervix at that time without history of abnormal Pap smears. 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 Jarold MottoPatterson her primary physician. Patient with past history of atrial fibrillation.  Patient has had history in the past H. pylori and was treated. She is being followed by her gastroenterologist Dr. Juanda ChanceBrodie.   Past medical history,surgical history, family history and social history were all reviewed and documented in the EPIC chart.  Gynecologic History No LMP recorded. Patient has had a hysterectomy. Contraception: status post hysterectomy Last Pap: 2012. Results were: normal Last mammogram: 2014. Results were: normal  Obstetric History OB History  Gravida Para Term Preterm AB SAB TAB Ectopic Multiple Living  2 2 2       2     # Outcome Date GA Lbr Len/2nd Weight Sex Delivery Anes PTL Lv  2 TRM           1 TRM                ROS: A ROS was performed and pertinent positives and negatives are included in the history.  GENERAL: No fevers or chills. HEENT: No change in vision, no earache, sore throat or sinus congestion. NECK: No pain or stiffness. CARDIOVASCULAR: No chest pain or pressure. No palpitations. PULMONARY: No  shortness of breath, cough or wheeze. GASTROINTESTINAL: No abdominal pain, nausea, vomiting or diarrhea, melena or bright red blood per rectum. GENITOURINARY: No urinary frequency, urgency, hesitancy or dysuria. MUSCULOSKELETAL: No joint or muscle pain, no back pain, no recent trauma. DERMATOLOGIC: No rash, no itching, no lesions. ENDOCRINE: No polyuria, polydipsia, no heat or cold intolerance. No recent change in weight. HEMATOLOGICAL: No anemia or easy bruising or bleeding. NEUROLOGIC: No headache, seizures, numbness, tingling or weakness. PSYCHIATRIC: No depression, no loss of interest in normal activity or change in sleep pattern.     Exam: chaperone present  BP 120/78  Ht 5' 3.75" (1.619 m)  Wt 136 lb (61.689 kg)  BMI 23.53 kg/m2  Body mass index is 23.53 kg/(m^2).  General appearance : Well developed well nourished female. No acute distress HEENT: Neck supple, trachea midline, no carotid bruits, no thyroidmegaly Lungs: Clear to auscultation, no rhonchi or wheezes, or rib retractions  Heart: Regular rate and rhythm, no murmurs or gallops Breast:Examined in sitting and supine position were symmetrical in appearance, no palpable masses or tenderness,  no skin retraction, no nipple inversion, no nipple discharge, no skin discoloration, no axillary or supraclavicular lymphadenopathy Abdomen: no palpable masses or tenderness, no rebound or guarding Extremities: no edema or skin discoloration or tenderness  Pelvic:  Bartholin, Urethra, Skene Glands: Within normal limits  Vagina: No gross lesions or discharge, vaginal atrophy  Cervix: Absent  Uterus absent  Adnexa  Without masses or tenderness  Anus and perineum  normal   Rectovaginal  normal sphincter tone without palpated masses or tenderness             Hemoccult PCP provides     Assessment/Plan:  68 y.o. with symptomatic vaginal atrophy in the past had been adamant starting on vaginal estrogen. The risks benefits and  pros and cons were discussed as well as the latest studies and the [facility]. Patient would like to start. She was prescribed estradiol 0.02% to apply twice a week. Her Pap smear was done today. Will be drawn by her PCP. She does not need a bone density study until next year. We discussed importance of calcium and vitamin D in regular exercise for osteoporosis prevention. She was reminded to schedule a mammogram in August of this year and continued to do her monthly self breast exam. She will check with her PCP to see if she has all her vaccinations up-to-date because she cannot recall if she had the Tdap and shingles vaccine in the past.   Note: This dictation was prepared with  Dragon/digital dictation along Personnel officerwithSmart phrase technology. Any transcriptional errors that result from this process are unintentional.   Ok EdwardsJuan H Fernandez MD, 1:45 PM 06/25/2013

## 2013-06-25 NOTE — Patient Instructions (Signed)
Tetanus, Diphtheria, Pertussis (Tdap) Vaccine  What You Need to Know  WHY GET VACCINATED?  Tetanus, diphtheria and pertussis can be very serious diseases, even for adolescents and adults. Tdap vaccine can protect us from these diseases.  TETANUS (Lockjaw) causes painful muscle tightening and stiffness, usually all over the body.  · It can lead to tightening of muscles in the head and neck so you can't open your mouth, swallow, or sometimes even breathe. Tetanus kills about 1 out of 5 people who are infected.  DIPHTHERIA can cause a thick coating to form in the back of the throat.  · It can lead to breathing problems, paralysis, heart failure, and death.  PERTUSSIS (Whooping Cough) causes severe coughing spells, which can cause difficulty breathing, vomiting and disturbed sleep.  · It can also lead to weight loss, incontinence, and rib fractures. Up to 2 in 100 adolescents and 5 in 100 adults with pertussis are hospitalized or have complications, which could include pneumonia and death.  These diseases are caused by bacteria. Diphtheria and pertussis are spread from person to person through coughing or sneezing. Tetanus enters the body through cuts, scratches, or wounds.  Before vaccines, the [place] saw as many as 200,000 cases a year of diphtheria and pertussis, and hundreds of cases of tetanus. Since vaccination began, tetanus and diphtheria have dropped by about 99% and pertussis by about 80%.  TDAP VACCINE  Tdap vaccine can protect adolescents and adults from tetanus, diphtheria, and pertussis. One dose of Tdap is routinely given at age 11 or 12. People who did not get Tdap at that age should get it as soon as possible.  Tdap is especially important for health care professionals and anyone having close contact with a baby younger than 12 months.  Pregnant women should get a dose of Tdap during every pregnancy, to protect the newborn from pertussis. Infants are most at risk for severe, life-threatening  complications from pertussis.  A similar vaccine, called Td, protects from tetanus and diphtheria, but not pertussis. A Td booster should be given every 10 years. Tdap may be given as one of these boosters if you have not already gotten a dose. Tdap may also be given after a severe cut or burn to prevent tetanus infection.  Your doctor can give you more information.  Tdap may safely be given at the same time as other vaccines.  SOME PEOPLE SHOULD NOT GET THIS VACCINE  · If you ever had a life-threatening allergic reaction after a dose of any tetanus, diphtheria, or pertussis containing vaccine, OR if you have a severe allergy to any part of this vaccine, you should not get Tdap. Tell your doctor if you have any severe allergies.  · If you had a coma, or long or multiple seizures within 7 days after a childhood dose of DTP or DTaP, you should not get Tdap, unless a cause other than the vaccine was found. You can still get Td.  · Talk to your doctor if you:  · have epilepsy or another nervous system problem,  · had severe pain or swelling after any vaccine containing diphtheria, tetanus or pertussis,  · ever had Guillain-Barré Syndrome (GBS),  · aren't feeling well on the day the shot is scheduled.  RISKS OF A VACCINE REACTION  With any medicine, including vaccines, there is a chance of side effects. These are usually mild and go away on their own, but serious reactions are also possible.  Brief fainting spells can follow   a vaccination, leading to injuries from falling. Sitting or lying down for about 15 minutes can help prevent these. Tell your doctor if you feel dizzy or light-headed, or have vision changes or ringing in the ears.  Mild problems following Tdap (Did not interfere with activities)  · Pain where the shot was given (about 3 in 4 adolescents or 2 in 3 adults)  · Redness or swelling where the shot was given (about 1 person in 5)  · Mild fever of at least 100.4°F (up to about 1 in 25 adolescents or 1 in  100 adults)  · Headache (about 3 or 4 people in 10)  · Tiredness (about 1 person in 3 or 4)  · Nausea, vomiting, diarrhea, stomach ache (up to 1 in 4 adolescents or 1 in 10 adults)  · Chills, body aches, sore joints, rash, swollen glands (uncommon)  Moderate problems following Tdap (Interfered with activities, but did not require medical attention)  · Pain where the shot was given (about 1 in 5 adolescents or 1 in 100 adults)  · Redness or swelling where the shot was given (up to about 1 in 16 adolescents or 1 in 25 adults)  · Fever over 102°F (about 1 in 100 adolescents or 1 in 250 adults)  · Headache (about 3 in 20 adolescents or 1 in 10 adults)  · Nausea, vomiting, diarrhea, stomach ache (up to 1 or 3 people in 100)  · Swelling of the entire arm where the shot was given (up to about 3 in 100).  Severe problems following Tdap (Unable to perform usual activities, required medical attention)  · Swelling, severe pain, bleeding and redness in the arm where the shot was given (rare).  A severe allergic reaction could occur after any vaccine (estimated less than 1 in a million doses).  WHAT IF THERE IS A SERIOUS REACTION?  What should I look for?  · Look for anything that concerns you, such as signs of a severe allergic reaction, very high fever, or behavior changes.  Signs of a severe allergic reaction can include hives, swelling of the face and throat, difficulty breathing, a fast heartbeat, dizziness, and weakness. These would start a few minutes to a few hours after the vaccination.  What should I do?  · If you think it is a severe allergic reaction or other emergency that can't wait, call 9-1-1 or get the person to the nearest hospital. Otherwise, call your doctor.  · Afterward, the reaction should be reported to the "Vaccine Adverse Event Reporting System" (VAERS). Your doctor might file this report, or you can do it yourself through the VAERS web site at www.vaers.hhs.gov, or by calling 1-800-822-7967.  VAERS is  only for reporting reactions. They do not give medical advice.   THE NATIONAL VACCINE INJURY COMPENSATION PROGRAM  The National Vaccine Injury Compensation Program (VICP) is a federal program that was created to compensate people who may have been injured by certain vaccines.  Persons who believe they may have been injured by a vaccine can learn about the program and about filing a claim by calling 1-800-338-2382 or visiting the VICP website at www.hrsa.gov/vaccinecompensation.  HOW CAN I LEARN MORE?  · Ask your doctor.  · Call your local or state health department.  · Contact the Centers for Disease Control and Prevention (CDC):  · Call 1-800-232-4636 or visit CDC's website at www.cdc.gov/vaccines.  CDC Tdap Vaccine VIS (06/23/11)  Document Released: 08/02/2011 Document Revised: 05/28/2012 Document Reviewed: 05/23/2012  ExitCare®   Patient Information ©2014 [employer].  Shingles Vaccine  What You Need to Know  WHAT IS SHINGLES?  · Shingles is a painful skin rash, often with blisters. It is also called Herpes Zoster or just Zoster.  · A shingles rash usually appears on one side of the face or body and lasts from 2 to 4 weeks. Its main symptom is pain, which can be quite severe. Other symptoms of shingles can include fever, headache, chills, and upset stomach. Very rarely, a shingles infection can lead to pneumonia, hearing problems, blindness, brain inflammation (encephalitis), or death.  · For about 1 person in 5, severe pain can continue even after the rash clears up. This is called post-herpetic neuralgia.  · Shingles is caused by the Varicella Zoster virus. This is the same virus that causes chickenpox. Only someone who has had a case of chickenpox or rarely, has gotten chickenpox vaccine, can get shingles. The virus stays in your body. It can reappear many years later to cause a case of shingles.  · You cannot catch shingles from another person with shingles. However, a person who has never had chickenpox (or  chickenpox vaccine) could get chickenpox from someone with shingles. This is not very common.  · Shingles is far more common in people 50 and older than in younger people. It is also more common in people whose immune systems are weakened because of a disease such as cancer or drugs such as steroids or chemotherapy.  · At least 1 million people get shingles per year in the [place].  SHINGLES VACCINE  · A vaccine for shingles was licensed in 2006. In clinical trials, the vaccine reduced the risk of shingles by 50%. It can also reduce the pain in people who still get shingles after being vaccinated.  · A single dose of shingles vaccine is recommended for adults 60 years of age and older.  SOME PEOPLE SHOULD NOT GET SHINGLES VACCINE OR SHOULD WAIT  A person should not get shingles vaccine if he or she:  · Has ever had a life-threatening allergic reaction to gelatin, the antibiotic neomycin, or any other component of shingles vaccine. Tell your caregiver if you have any severe allergies.  · Has a weakened immune system because of current:  · AIDS or another disease that affects the immune system.  · Treatment with drugs that affect the immune system, such as prolonged use of high-dose steroids.  · Cancer treatment, such as radiation or chemotherapy.  · Cancer affecting the bone marrow or lymphatic system, such as leukemia or lymphoma.  · Is pregnant, or might be pregnant. Women should not become pregnant until at least 4 weeks after getting shingles vaccine.  Someone with a minor illness, such as a cold, may be vaccinated. Anyone with a moderate or severe acute illness should usually wait until he or she recovers before getting the vaccine. This includes anyone with a temperature of 101.3° F (38° C) or higher.  WHAT ARE THE RISKS FROM SHINGLES VACCINE?  · A vaccine, like any medicine, could possibly cause serious problems, such as severe allergic reactions. However, the risk of a vaccine causing serious harm, or  death, is extremely small.  · No serious problems have been identified with shingles vaccine.  Mild Problems  · Redness, soreness, swelling, or itching at the site of the injection (about 1 person in 3).  · Headache (about 1 person in 70).  Like all vaccines, shingles vaccine is being closely monitored for   unusual or severe problems.  WHAT IF THERE IS A MODERATE OR SEVERE REACTION?  What should I look for?  Any unusual condition, such as a severe allergic reaction or a high fever. If a severe allergic reaction occurred, it would be within a few minutes to an hour after the shot. Signs of a serious allergic reaction can include difficulty breathing, weakness, hoarseness or wheezing, a fast heartbeat, hives, dizziness, paleness, or swelling of the throat.  What should I do?  · Call your caregiver, or get the person to a caregiver right away.  · Tell the caregiver what happened, the date and time it happened, and when the vaccination was given.  · Ask the caregiver to report the reaction by filing a Vaccine Adverse Event Reporting System (VAERS) form. Or, you can file this report through the VAERS web site at www.vaers.hhs.gov or by calling 1-800-822-7967.  VAERS does not provide medical advice.  HOW CAN I LEARN MORE?  · Ask your caregiver. He or she can give you the vaccine package insert or suggest other sources of information.  · Contact the Centers for Disease Control and Prevention (CDC):  · Call 1-800-232-4636 (1-800-CDC-INFO).  · Visit the CDC website at www.cdc.gov/vaccines  CDC Shingles Vaccine VIS (11/20/07)  Document Released: 11/28/2005 Document Revised: 04/25/2011 Document Reviewed: 05/23/2012  ExitCare® Patient Information ©2014 [employer].

## 2013-07-23 ENCOUNTER — Ambulatory Visit (INDEPENDENT_AMBULATORY_CARE_PROVIDER_SITE_OTHER): Payer: Medicare Other | Admitting: Gynecology

## 2013-07-23 ENCOUNTER — Encounter: Payer: Self-pay | Admitting: Gynecology

## 2013-07-23 VITALS — BP 124/70

## 2013-07-23 DIAGNOSIS — N893 Dysplasia of vagina, unspecified: Secondary | ICD-10-CM

## 2013-07-23 MED ORDER — NONFORMULARY OR COMPOUNDED ITEM: Status: DC

## 2013-07-23 NOTE — Progress Notes (Signed)
   Patient presented to the office today for colposcopic evaluation of the vagina. Patient was seen for her annual exam on 06/25/2013.She had a vaginal hysterectomy with posterior repair secondary to prolapse in 2006 and had a benign cervix at that time without history of abnormal Pap smears. 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 High-risk HPV not detected  The patient because of her vaginal atrophy had been prescribed vaginal estrogen but has not started it yet. The patient underwent a [facility] colposcopic evaluation of the external genitalia perineum and perirectal region with no lesions seen. Acetic acid was applied in the vagina. Systematic inspection of the entire vagina demonstrated no lesions. Evidence of vaginal atrophy and friability of the mucosa was evident.  Assessment/plan: Patient with low grade dysplasia of the vagina. No lesion seen on colposcopy. Negative HPV. We are going to start her on vaginal estrogen twice a week and she will return back in 6 months for followup Pap smear.

## 2013-07-23 NOTE — Patient Instructions (Signed)
Colposcopy  Colposcopy is a procedure to examine your cervix and vagina, or the area around the outside of your vagina, for abnormalities or signs of disease. The procedure is done using a lighted microscope called a colposcope. Tissue samples may be collected during the colposcopy if your health care provider finds any unusual cells. A colposcopy may be done if a woman has:   An abnormal Pap test. A Pap test is a medical test done to evaluate cells that are on the surface of the cervix.   A Pap test result that is suggestive of human papillomavirus (HPV). This virus can cause genital warts and is linked to the development of cervical cancer.   A sore on her cervix and the results of a Pap test were normal.   Genital warts on the cervix or in or around the outside of the vagina.   A mother who took the drug diethylstilbestrol (DES) while pregnant.   Painful intercourse.   Vaginal bleeding, especially after sexual intercourse.  LET YOUR HEALTH CARE PROVIDER KNOW ABOUT:   Any allergies you have.   All medicines you are taking, including vitamins, herbs, eye drops, creams, and over-the-counter medicines.   Previous problems you or members of your family have had with the use of anesthetics.   Any blood disorders you have.   Previous surgeries you have had.   Medical conditions you have.  RISKS AND COMPLICATIONS  Generally, a colposcopy is a safe procedure. However, as with any procedure, complications can occur. Possible complications include:   Bleeding.   Infection.   Missed lesions.  BEFORE THE PROCEDURE    Tell your health care provider if you have your menstrual period. A colposcopy typically is not done during menstruation.   For 24 hours before the colposcopy, do not:   Douche.   Use tampons.   Use medicines, creams, or suppositories in the vagina.   Have sexual intercourse.  PROCEDURE   During the procedure, you will be lying on your back with your feet in foot rests (stirrups). A warm  metal or plastic instrument (speculum) will be placed in your vagina to keep it open and to allow the health care provider to see the cervix. The colposcope will be placed outside the vagina. It will be used to magnify and examine the cervix, vagina, and the area around the outside of the vagina. A small amount of liquid solution will be placed on the area that is to be viewed. This solution will make it easier to see the abnormal cells. Your health care provider will use tools to suck out mucus and cells from the canal of the cervix. Then he or she will record the location of the abnormal areas.  If a biopsy is done during the procedure, a medicine will usually be given to numb the area (local anesthetic). You may feel mild pain or cramping while the biopsy is done. After the procedure, tissue samples collected during the biopsy will be sent to a lab for analysis.  AFTER THE PROCEDURE   You will be given instructions on when to follow up with your health care provider for your test results. It is important to keep your appointment.  Document Released: 04/23/2002 Document Revised: 10/03/2012 Document Reviewed: 08/30/2012  [employer] Patient Information 2014 [employer].

## 2013-10-14 ENCOUNTER — Other Ambulatory Visit: Payer: Self-pay | Admitting: *Deleted

## 2013-10-14 ENCOUNTER — Telehealth: Payer: Self-pay | Admitting: Internal Medicine

## 2013-10-14 ENCOUNTER — Encounter: Payer: Self-pay | Admitting: Internal Medicine

## 2013-10-14 MED ORDER — HYOSCYAMINE SULFATE 0.125 MG SL SUBL: SUBLINGUAL_TABLET | SUBLINGUAL | Status: DC

## 2013-10-14 NOTE — Telephone Encounter (Signed)
Spoke with patient and she states she has had left sided pain x 2 weeks. The pain is below the belly button and goes across her stomach. She also states she had back pain. Reports diarrhea and constipation alternating. She has had a little nausea but no vomiting. She thinks she has had fever off and on. She has taken Ibuprofen for pain without relief. Please, advise.

## 2013-10-14 NOTE — Telephone Encounter (Signed)
Patient given recommendations. Rx sent. She will call back if it does not improve.

## 2013-10-14 NOTE — Telephone Encounter (Signed)
She is up to date on colonoscopy, recall 05/2015. Mild diverticulosis. Please try Levsin SL.125 mg, q 4-6 hours prn abd pain and start Benefiber 1 tsp daily. If no improvement in 4 weeks, consider CT scan of the abd/pelvis.

## 2013-10-23 ENCOUNTER — Ambulatory Visit (INDEPENDENT_AMBULATORY_CARE_PROVIDER_SITE_OTHER): Payer: Medicare Other | Admitting: Internal Medicine

## 2013-10-23 ENCOUNTER — Encounter: Payer: Self-pay | Admitting: Internal Medicine

## 2013-10-23 VITALS — BP 140/76 | HR 61 | Ht 64.0 in | Wt 134.0 lb

## 2013-10-23 DIAGNOSIS — I4891 Unspecified atrial fibrillation: Secondary | ICD-10-CM

## 2013-10-23 DIAGNOSIS — I480 Paroxysmal atrial fibrillation: Secondary | ICD-10-CM

## 2013-10-23 NOTE — Patient Instructions (Signed)
Your physician recommends that you continue on your current medications as directed. Please refer to the Current Medication list given to you today.  Your physician has requested that you have a lexiscan myoview. For further information please visit [email or web address] Please follow instruction sheet, as given.  Follow up to determined after stress testing.

## 2013-10-23 NOTE — Progress Notes (Signed)
ELECTROPHYSIOLOGY CONSULT NOTE  Patient ID: Heidi Chase, MRN: [id number], DOB/AGE: 68/30/1947 68 y.o. Admit date: (Not on file) Date of Consult: 10/23/2013  Primary Physician: Garlan Fillers, MD Primary Cardiologist:new  Chief Complaint: chest pain   HPI Heidi Chase is a 68 y.o. female   Seen at the hiatus of more than 5 years. At that point she was seen for paroxysmal atrial fibrillation and noncardiac chest pain in the context of a very difficult situation at home  She comes in again now with chest discomfort of an atypical nature it radiates through to the back it is not clearly associated with exertion there is associated with stress. There is occasionally brackish taste. She is on H2 blocker.  She exercises modestly but not vigorously.  She denies nocturnal dyspnea orthopnea or peripheral edema. She has diabetes and apparently hypercholesterolemia. Her blood pressures are normally quite good.   She underwent catheterization 2008 normal ; echocardiogram 2006 was normal   Past Medical History  Diagnosis Date  . Hyperlipidemia   . Fatty liver   . SVT (supraventricular tachycardia)   . Uterine hyperplasia   . Positive H. pylori test 05/2003    positive x 2  . Diabetes mellitus   . Atrial fibrillation   . Elevated LFTs   . IBS (irritable bowel syndrome)   . GERD (gastroesophageal reflux disease)   . Diverticulosis   . Anxiety   . VAIN I (vaginal intraepithelial neoplasia grade I)       Surgical History:  Past Surgical History  Procedure Laterality Date  . Rotator cuff repair  10/2000    left  . Dilation and curettage, diagnostic / therapeutic    . Tonsillectomy    . Laparoscopy  1974  . Thumb surgery  10/2000  . Breast biopsy      left  . Pr denies breast biopsy    . Partial hysterectomy - per pt    . Repair rectocele    . Pelvic laparoscopy    . Vaginal hysterectomy  03/23/04  . Colposcopy  2007     Home Meds: Prior to Admission medications     Medication Sig Start Date End Date Taking? Authorizing Provider  ALPRAZolam (XANAX) 0.25 MG tablet Take 0.25 mg by mouth 2 (two) times daily as needed.     Yes Historical Provider, MD  diazepam (VALIUM) 2 MG tablet Take 1 tablet (2 mg total) by mouth 2 (two) times daily as needed. 10/12/12  Yes Hart Carwin, MD  diphenhydrAMINE (BENADRYL) 25 MG tablet Take 25 mg by mouth as needed.     Yes Historical Provider, MD  metFORMIN (GLUCOPHAGE-XR) 500 MG 24 hr tablet Take 500 mg by mouth 2 (two) times daily.     Yes Historical Provider, MD  metoprolol (TOPROL-XL) 50 MG 24 hr tablet Take 25 mg by mouth daily.    Yes Historical Provider, MD  NONFORMULARY OR COMPOUNDED ITEM Estradiol .02% 1 ML Prefilled Applicator Sig: apply vaginally twice a week #90 Day Supply with 4 refills 06/25/13  Yes Ok Edwards, MD  NONFORMULARY OR COMPOUNDED ITEM Estradiol .02% 1 ML Prefilled Applicator Sig: apply vaginally twice a week #90 Day Supply with 4 refills 07/23/13  Yes Ok Edwards, MD  ranitidine (ZANTAC) 75 MG tablet Take 75 mg by mouth 2 (two) times daily.   Yes Historical Provider, MD  sertraline (ZOLOFT) 50 MG tablet Take 50 mg by mouth daily.   Yes Historical Provider, MD  Allergies: No Known Allergies  History   Social History  . Marital Status: Divorced    Spouse Name: N/A    Number of Children: N/A  . Years of Education: N/A   Occupational History  . Not on file.   Social History Main Topics  . Smoking status: Never Smoker   . Smokeless tobacco: Never Used  . Alcohol Use: No  . Drug Use: No  . Sexual Activity: Yes    Birth Control/ Protection: Post-menopausal, Surgical   Other Topics Concern  . Not on file   Social History Narrative  . No narrative on file     Family History  Problem Relation Age of Onset  . Heart disease Father   . Colon cancer Father 75  . Hypertension Father   . Heart attack Mother   . Diabetes Mother   . Hypertension Mother   . Colon cancer  Mother 64  . Heart disease Mother   . Heart attack Maternal Grandfather   . Heart attack Maternal Grandmother   . Heart attack Paternal Grandfather   . Heart disease Sister   . Diabetes Sister   . Hypertension Sister   . Cancer Sister     UTERINE  . Uterine cancer Sister   . Cancer Sister     UTERINE  . Colon cancer Brother 60     ROS:  Please see the history of present illness.   Negative except Arthritis  All other systems reviewed and negative.    Physical Exam:  Blood pressure 140/76, pulse 61. General: Well developed, well nourished female in no acute distress. Head: Normocephalic, atraumatic, sclera non-icteric, no xanthomas, nares are without discharge. EENT: normal Lymph Nodes:  none Back: without scoliosis/kyphosis, no CVA tendersness Neck: Negative for carotid bruits. JVD not elevated. Lungs: Clear bilaterally to auscultation without wheezes, rales, or rhonchi. Breathing is unlabored. Heart: RRR with S1 S2. No  murmur , rubs, or gallops appreciated. Abdomen: Soft, non-tender, non-distended with normoactive bowel sounds. No hepatomegaly. No rebound/guarding. No obvious abdominal masses. Msk:  Strength and tone appear normal for age. Extremities: No clubbing or cyanosis. No  edema.  Distal pedal pulses are 2+ and equal bilaterally. Skin: Warm and Dry Neuro: Alert and oriented X 3. CN III-XII intact Grossly normal sensory and motor function . Psych:  Responds to questions appropriately with a normal affect.      Labs: Cardiac Enzymes No results found for this basename: CKTOTAL, CKMB, TROPONINI,  in the last 72 hours CBC Lab Results  Component Value Date   WBC 8.2 03/23/2007   HGB 14.4 03/23/2007   HCT 42.5 03/23/2007   MCV 90.5 03/23/2007   PLT 197 03/23/2007   PROTIME: No results found for this basename: LABPROT, INR,  in the last 72 hours Chemistry No results found for this basename: NA, K, CL, CO2, BUN, CREATININE, CALCIUM, LABALBU, PROT, BILITOT, ALKPHOS, ALT,  AST, GLUCOSE,  in the last 168 hours Lipids Lab Results  Component Value Date   CHOL 263* 03/23/2007   HDL 34.4* 03/23/2007   TRIG 865* 03/23/2007   BNP No results found for this basename: probnp   Miscellaneous No results found for this basename: DDIMER    Radiology/Studies:  No results found.  EKG:  Sinus rhythm at 61 intervals 15/09/43   Assessment and Plan:     Chest pain with typical and atypical features   Atrial fibrillation-according to the record   Diabetes   Elevated blood pressure   The patient  has chest pain with typical and atypical features in the context of moderate cardiac risk factors, will undertake stress testing. We will use LexiScan as well.  I will ask Dr. Jarold Motto to see whether she would be a candidate for ACE inhibitors for renal protection the context of her diabetes. We also need to follow her along to see whether she is in atrial fibrillation his anticoagulation would be indicated.    Sherryl Manges

## 2013-11-04 ENCOUNTER — Ambulatory Visit (HOSPITAL_COMMUNITY): Payer: Medicare Other | Attending: Cardiology | Admitting: Radiology

## 2013-11-04 VITALS — BP 124/73 | HR 54 | Ht 64.0 in | Wt 134.0 lb

## 2013-11-04 DIAGNOSIS — R079 Chest pain, unspecified: Secondary | ICD-10-CM | POA: Diagnosis present

## 2013-11-04 DIAGNOSIS — R002 Palpitations: Secondary | ICD-10-CM | POA: Insufficient documentation

## 2013-11-04 DIAGNOSIS — I4891 Unspecified atrial fibrillation: Secondary | ICD-10-CM

## 2013-11-04 DIAGNOSIS — I480 Paroxysmal atrial fibrillation: Secondary | ICD-10-CM

## 2013-11-04 MED ORDER — TECHNETIUM TC 99M SESTAMIBI GENERIC - CARDIOLITE
30.0000 | Freq: Once | INTRAVENOUS | Status: AC | PRN
  Administered 2013-11-04: 30 via INTRAVENOUS

## 2013-11-04 MED ORDER — TECHNETIUM TC 99M SESTAMIBI GENERIC - CARDIOLITE
10.0000 | Freq: Once | INTRAVENOUS | Status: AC | PRN
  Administered 2013-11-04: 10 via INTRAVENOUS

## 2013-11-04 NOTE — Progress Notes (Signed)
[facility] SITE 3 NUCLEAR MED [address] [place], [place] [address] [phone]    Cardiology Nuclear Med Study  Heidi Chase is a 68 y.o. female     MRN : [id number]     DOB: April 26, 1945  Procedure Date: 11/04/2013  Nuclear Med Background Indication for Stress Test:  Evaluation for Ischemia History:  MPI 2006 (normal) EF 71%, hx Afib Cardiac Risk Factors: N/A  Symptoms:  Chest Pain (last date of chest discomfort was one weeek ago) and Palpitations   Nuclear Pre-Procedure Caffeine/Decaff Intake:  None NPO After: 8:30pm   Lungs:  clear O2 Sat: 98% on room air. IV 0.9% NS with Angio Cath:  22g  IV Site: R Hand  IV Started by:  Bonnita Levan, RN  Chest Size (in):  38 Cup Size: C  Height:  (1.626 m)  Weight:  134 lb (60.782 kg)  BMI:  Body mass index is 22.99 kg/(m^2). Tech Comments:  N/A    Nuclear Med Study 1 or 2 day study: 1 day  Stress Test Type:  Treadmill/Lexiscan  Reading MD: N/A  Order Authorizing Provider:  Berton Mount, MD  Resting Radionuclide: Technetium 35m Sestamibi  Resting Radionuclide Dose: 11.0 mCi   Stress Radionuclide:  Technetium 57m Sestamibi  Stress Radionuclide Dose: 33.0 mCi           Stress Protocol Rest HR: 54 Stress HR: 100  Rest BP: 124/73 Stress BP: 129/72  Exercise Time (min): n/a METS: n/a           Dose of Adenosine (mg):  n/a Dose of Lexiscan: 0.4 mg  Dose of Atropine (mg): n/a Dose of Dobutamine: n/a mcg/kg/min (at max HR)  Stress Test Technologist: Nelson Chimes, BS-ES  Nuclear Technologist:  Kerby Nora, CNMT     Rest Procedure:  Myocardial perfusion imaging was performed at rest 45 minutes following the intravenous administration of Technetium 71m Sestamibi. Rest ECG: Normal sinus rhythm. No significant abnormalities.  Stress Procedure:  The patient received IV Lexiscan 0.4 mg over 15-seconds with concurrent low level exercise and then Technetium 15m Sestamibi was injected at 30-seconds while the patient  continued walking one more minute.  Quantitative spect images were obtained after a 45-minute delay.  During the infusion of Lexiscan the patient complained of dizziness, chest heaviness and fatigue.  These symptoms slowly began to resolve.   Stress ECG: No significant change from baseline ECG  QPS Raw Data Images:  Normal; no motion artifact; normal heart/lung ratio. Stress Images:  Normal homogeneous uptake in all areas of the myocardium. Rest Images:  Normal homogeneous uptake in all areas of the myocardium. Subtraction (SDS):  No evidence of ischemia. Transient Ischemic Dilatation (Normal <1.22):  0.96 Lung/Heart Ratio (Normal <0.45):  0.38  Quantitative Gated Spect Images QGS EDV:  46 ml QGS ESV:  12 ml  Impression Exercise Capacity:  Lexiscan with low level exercise. BP Response:  Normal blood pressure response. Clinical Symptoms:  Dizziness ECG Impression:  No significant ST segment change suggestive of ischemia. Comparison with Prior Nuclear Study: Study is compared to the report of the study from January, 2006.  Overall Impression:  Normal stress nuclear study. There is no scar or ischemia. This is a low risk scan. There is no change from the prior study.  LV Ejection Fraction: 74%.  LV Wall Motion:  Normal Wall Motion.  Willa Rough, MD

## 2013-11-15 ENCOUNTER — Telehealth: Payer: Self-pay | Admitting: Internal Medicine

## 2013-11-15 NOTE — Telephone Encounter (Signed)
Pt aware of stress test results. Copy forwarded to Dr. Jarold MottoPatterson at pt request.  [facility] ? About follow-up appt for pt  Mylo Redebbie Temara Lanum RN

## 2013-11-15 NOTE — Telephone Encounter (Signed)
New message ° ° ° ° °Want stress test results °

## 2013-11-29 NOTE — Telephone Encounter (Signed)
Left message for patient explaining that Dr. Graciela HusbandsKlein would like to see her for 6 month follow up. Informed her that she would receive a recall letter reminding her to make the appointment.

## 2013-12-16 ENCOUNTER — Encounter: Payer: Self-pay | Admitting: Internal Medicine

## 2014-01-22 ENCOUNTER — Ambulatory Visit: Payer: Medicare Other | Admitting: Gynecology

## 2014-02-13 ENCOUNTER — Ambulatory Visit (INDEPENDENT_AMBULATORY_CARE_PROVIDER_SITE_OTHER): Payer: Medicare Other | Admitting: Gynecology

## 2014-02-13 ENCOUNTER — Other Ambulatory Visit (HOSPITAL_COMMUNITY)
Admission: RE | Admit: 2014-02-13 | Discharge: 2014-02-13 | Disposition: A | Discharge status: Home / Self Care | Payer: Medicare Other | Source: Ambulatory Visit | Attending: Gynecology | Admitting: Gynecology

## 2014-02-13 ENCOUNTER — Encounter: Payer: Self-pay | Admitting: Gynecology

## 2014-02-13 VITALS — BP 120/82

## 2014-02-13 DIAGNOSIS — Z01411 Encounter for gynecological examination (general) (routine) with abnormal findings: Secondary | ICD-10-CM | POA: Diagnosis present

## 2014-02-13 DIAGNOSIS — N893 Dysplasia of vagina, unspecified: Secondary | ICD-10-CM

## 2014-02-13 MED ORDER — NONFORMULARY OR COMPOUNDED ITEM: Status: DC

## 2014-02-13 NOTE — Progress Notes (Signed)
   Patient presented for her six-month follow-up Pap smear. Her history is as follows:  Patient was seen for her annual exam on 06/25/2013.She had a vaginal hysterectomy with posterior repair secondary to prolapse in 2006 and had a benign cervix at that time without history of abnormal Pap smears. 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 High-risk HPV not detected  The patient because of her vaginal atrophy had been prescribed vaginal estrogen but has not started it yet. The patient underwent a detailed colposcopic evaluation of the external genitalia perineum and perirectal region with no lesions seen. Acetic acid was applied in the vagina. Systematic inspection of the entire vagina demonstrated no lesions. Evidence of vaginal atrophy and friability of the mucosa was evident.  The assessment and plan at that office visit on June 9 was as follows:Patient with low grade dysplasia of the vagina. No lesion seen on colposcopy. Negative HPV. We are going to start her on vaginal estrogen twice a week and she will return back in 6 months for followup Pap smear  Pap smear was repeated today vaginal atrophy decreased significantly since she has been using the vaginal estrogen cream which she will continue to use twice a week. Patient otherwise scheduled return back in 6 months for annual exam or when necessary.

## 2014-02-17 LAB — CYTOLOGY - PAP

## 2014-07-23 ENCOUNTER — Ambulatory Visit (INDEPENDENT_AMBULATORY_CARE_PROVIDER_SITE_OTHER): Payer: Medicare Other | Admitting: Gynecology

## 2014-07-23 ENCOUNTER — Encounter: Payer: Self-pay | Admitting: Gynecology

## 2014-07-23 ENCOUNTER — Other Ambulatory Visit (HOSPITAL_COMMUNITY)
Admission: RE | Admit: 2014-07-23 | Discharge: 2014-07-23 | Disposition: A | Discharge status: Home / Self Care | Payer: Medicare Other | Source: Ambulatory Visit | Attending: Gynecology | Admitting: Gynecology

## 2014-07-23 VITALS — BP 120/82 | Ht 64.0 in | Wt 137.0 lb

## 2014-07-23 DIAGNOSIS — Z01419 Encounter for gynecological examination (general) (routine) without abnormal findings: Secondary | ICD-10-CM

## 2014-07-23 DIAGNOSIS — K5900 Constipation, unspecified: Secondary | ICD-10-CM | POA: Diagnosis not present

## 2014-07-23 DIAGNOSIS — R87619 Unspecified abnormal cytological findings in specimens from cervix uteri: Secondary | ICD-10-CM | POA: Insufficient documentation

## 2014-07-23 DIAGNOSIS — M8580 Other specified disorders of bone density and structure, unspecified site: Secondary | ICD-10-CM | POA: Diagnosis not present

## 2014-07-23 DIAGNOSIS — Z1151 Encounter for screening for human papillomavirus (HPV): Secondary | ICD-10-CM | POA: Insufficient documentation

## 2014-07-23 DIAGNOSIS — N890 Mild vaginal dysplasia: Secondary | ICD-10-CM

## 2014-07-23 DIAGNOSIS — Z124 Encounter for screening for malignant neoplasm of cervix: Secondary | ICD-10-CM | POA: Diagnosis present

## 2014-07-23 MED ORDER — NONFORMULARY OR COMPOUNDED ITEM: Status: DC

## 2014-07-23 MED ORDER — LINACLOTIDE 145 MCG PO CAPS: 145.0000 ug | ORAL_CAPSULE | Freq: Every day | ORAL | Status: DC

## 2014-07-23 NOTE — Progress Notes (Signed)
Heidi Chase [birth year] [id number]   History:    69 y.o.  for annual gyn exam with no complaints today. Review of patient's record indicated the following:  Patient on 06/25/2013 had been seen for her annual exam.She had a vaginal hysterectomy with posterior repair secondary to prolapse in 2006 and had a benign cervix at that time without history of abnormal Pap smears. 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 High-risk HPV not detected  Patient had vaginal atrophy was started on vaginal discharge and cream twice a week. She underwent a detail colposcopic evaluation in December 2015 and no lesions were noted. She's here for follow-up Pap smear as well patient states that at times she has to digitalize herself during a bowel movement if she is constipated. She has a bowel movement every 3 days. She reports normal colonoscopy in 2014. Her mammogram is overdue as well. Her PCP is Dr. Jarold Motto has been doing her blood work and she states that her immunizations are up-to-date. She does have history of osteopenia with normal Frax analysis in 2014 reported.  Past medical history,surgical history, family history and social history were all reviewed and documented in the EPIC chart.  Gynecologic History No LMP recorded. Patient has had a hysterectomy. Contraception: status post hysterectomy Last Pap: See above. Results were: See above Last mammogram: 2014. Results were: normal  Obstetric History OB History  Gravida Para Term Preterm AB SAB TAB Ectopic Multiple Living  # Outcome Date GA Lbr Len/2nd Weight Sex Delivery Anes PTL Lv  2 Term           1 Term                ROS: A ROS was performed and pertinent positives and negatives  are included in the history.  GENERAL: No fevers or chills. HEENT: No change in vision, no earache, sore throat or sinus congestion. NECK: No pain or stiffness. CARDIOVASCULAR: No chest pain or pressure. No palpitations. PULMONARY: No shortness of breath, cough or wheeze. GASTROINTESTINAL: No abdominal pain, nausea, vomiting or diarrhea, melena or bright red blood per rectum. GENITOURINARY: No urinary frequency, urgency, hesitancy or dysuria. MUSCULOSKELETAL: No joint or muscle pain, no back pain, no recent trauma. DERMATOLOGIC: No rash, no itching, no lesions. ENDOCRINE: No polyuria, polydipsia, no heat or cold intolerance. No recent change in weight. HEMATOLOGICAL: No anemia or easy bruising or bleeding. NEUROLOGIC: No headache, seizures, numbness, tingling or weakness. PSYCHIATRIC: No depression, no loss of interest in normal activity or change in sleep pattern.     Exam: chaperone present  BP 120/82 mmHg  Ht  (1.626 m)  Wt 137 lb (62.143 kg)  BMI 23.50 kg/m2  Body mass index is 23.5 kg/(m^2).  General appearance : Well developed well nourished female. No acute distress HEENT: Eyes: no retinal hemorrhage or exudates,  Neck supple, trachea midline, no carotid bruits, no thyroidmegaly Lungs: Clear to auscultation, no rhonchi or wheezes, or rib retractions  Heart: Regular rate and rhythm, no murmurs or gallops Breast:Examined in sitting and supine position were symmetrical in appearance, no palpable masses or tenderness,  no skin retraction, no nipple inversion, no nipple discharge, no skin discoloration, no  axillary or supraclavicular lymphadenopathy Abdomen: no palpable masses or tenderness, no rebound or guarding Extremities: no edema or skin discoloration or tenderness  Pelvic:  Bartholin, Urethra, Skene Glands: Within normal limits             Vagina: No gross lesions or discharge  Cervix: Absent  Uterus  absent  Adnexa  Without masses or tenderness  Anus and perineum  normal    Rectovaginal  normal sphincter tone without palpated masses or tenderness             Hemoccult PCP provides     Assessment/Plan:  69 y.o. female for annual exam with history of VAIN I negative HPV in 2015 and negative colposcopy. Pap smear with HPV screening repeated today as per guidelines. Primary care physician will be doing her blood work. We discussed importance of calcium vitamin D and regular exercise for osteoporosis prevention. September this year patient is to schedule her mammogram as well as for bone density study. For her constipation she's going to be prescribed Linzess 145 g 1 tablet by mouth daily. If symptoms continue that she is required to digitalize herself she will need to return back to the office for reassessment. There was no evidence of rectocele uterine prolapse during the examination today. And she had normal neurological tone to the perineum and perirectal region when tested. Also patient had good sphincter tone.   Ok EdwardsFERNANDEZ,Camreigh Michie H MD, 10:36 AM 07/23/2014

## 2014-07-23 NOTE — Patient Instructions (Signed)
Need bone density and mammogram in September of this year

## 2014-07-25 LAB — CYTOLOGY - PAP

## 2014-10-23 ENCOUNTER — Other Ambulatory Visit: Payer: Self-pay | Admitting: Gastroenterology

## 2014-11-10 ENCOUNTER — Encounter: Payer: Self-pay | Admitting: Gastroenterology

## 2015-01-21 ENCOUNTER — Other Ambulatory Visit: Payer: Self-pay | Admitting: Gastroenterology

## 2015-02-15 HISTORY — PX: COLONOSCOPY: SHX174

## 2015-04-21 ENCOUNTER — Other Ambulatory Visit: Payer: Self-pay | Admitting: Gastroenterology

## 2015-06-14 ENCOUNTER — Encounter (HOSPITAL_COMMUNITY): Payer: Self-pay | Admitting: *Deleted

## 2015-06-14 ENCOUNTER — Emergency Department (HOSPITAL_COMMUNITY)
Admission: EM | Admit: 2015-06-14 | Discharge: 2015-06-14 | Disposition: A | Discharge status: Home / Self Care | Payer: Medicare Other | Attending: Emergency Medicine | Admitting: Emergency Medicine

## 2015-06-14 ENCOUNTER — Emergency Department (HOSPITAL_COMMUNITY): Payer: Medicare Other

## 2015-06-14 DIAGNOSIS — Z8619 Personal history of other infectious and parasitic diseases: Secondary | ICD-10-CM | POA: Insufficient documentation

## 2015-06-14 DIAGNOSIS — K219 Gastro-esophageal reflux disease without esophagitis: Secondary | ICD-10-CM | POA: Diagnosis not present

## 2015-06-14 DIAGNOSIS — K589 Irritable bowel syndrome without diarrhea: Secondary | ICD-10-CM | POA: Insufficient documentation

## 2015-06-14 DIAGNOSIS — R079 Chest pain, unspecified: Secondary | ICD-10-CM | POA: Insufficient documentation

## 2015-06-14 DIAGNOSIS — Z79899 Other long term (current) drug therapy: Secondary | ICD-10-CM | POA: Insufficient documentation

## 2015-06-14 DIAGNOSIS — Z8742 Personal history of other diseases of the female genital tract: Secondary | ICD-10-CM | POA: Insufficient documentation

## 2015-06-14 DIAGNOSIS — F419 Anxiety disorder, unspecified: Secondary | ICD-10-CM | POA: Diagnosis not present

## 2015-06-14 DIAGNOSIS — E119 Type 2 diabetes mellitus without complications: Secondary | ICD-10-CM | POA: Diagnosis not present

## 2015-06-14 DIAGNOSIS — Z7984 Long term (current) use of oral hypoglycemic drugs: Secondary | ICD-10-CM | POA: Diagnosis not present

## 2015-06-14 DIAGNOSIS — R002 Palpitations: Secondary | ICD-10-CM

## 2015-06-14 LAB — I-STAT TROPONIN, ED
TROPONIN I, POC: 0 ng/mL (ref 0.00–0.08)
Troponin i, poc: 0 ng/mL (ref 0.00–0.08)

## 2015-06-14 LAB — BASIC METABOLIC PANEL
ANION GAP: 13 (ref 5–15)
BUN: 15 mg/dL (ref 6–20)
CALCIUM: 9.8 mg/dL (ref 8.9–10.3)
CO2: 21 mmol/L — AB (ref 22–32)
Chloride: 107 mmol/L (ref 101–111)
Creatinine, Ser: 0.94 mg/dL (ref 0.44–1.00)
GFR calc Af Amer: >60 mL/min (ref >60)
GLUCOSE: 82 mg/dL (ref 65–99)
Potassium: 4 mmol/L (ref 3.5–5.1)
Sodium: 141 mmol/L (ref 135–145)

## 2015-06-14 LAB — CBC
HCT: 46.3 % — ABNORMAL HIGH (ref 36.0–46.0)
HEMOGLOBIN: 15.7 g/dL — AB (ref 12.0–15.0)
MCH: 29.7 pg (ref 26.0–34.0)
MCHC: 33.9 g/dL (ref 30.0–36.0)
MCV: 87.7 fL (ref 78.0–100.0)
PLATELETS: 246 10*3/uL (ref 150–400)
RBC: 5.28 MIL/uL — ABNORMAL HIGH (ref 3.87–5.11)
RDW: 13.1 % (ref 11.5–15.5)
WBC: 9.1 10*3/uL (ref 4.0–10.5)

## 2015-06-14 NOTE — Discharge Instructions (Signed)
Your heart is in a normal sinus rhythm today. The cause of your palpitations is not known at this time. Please contact the cardiology office first thing in the morning for follow-up appointment and possible Holter monitoring. Get rechecked immediately if you develop any chest pain, lightheadedness, new or worrisome symptoms.   Palpitations A palpitation is the feeling that your heartbeat is irregular or is faster than normal. It may feel like your heart is fluttering or skipping a beat. Palpitations are usually not a serious problem. However, in some cases, you may need further medical evaluation. CAUSES  Palpitations can be caused by:  Smoking.  Caffeine or other stimulants, such as diet pills or energy drinks.  Alcohol.  Stress and anxiety.  Strenuous physical activity.  Fatigue.  Certain medicines.  Heart disease, especially if you have a history of irregular heart rhythms (arrhythmias), such as atrial fibrillation, atrial flutter, or supraventricular tachycardia.  An improperly working pacemaker or defibrillator. DIAGNOSIS  To find the cause of your palpitations, your health care provider will take your medical history and perform a physical exam. Your health care provider may also have you take a test called an ambulatory electrocardiogram (ECG). An ECG records your heartbeat patterns over a 24-hour period. You may also have other tests, such as:  Transthoracic echocardiogram (TTE). During echocardiography, sound waves are used to evaluate how blood flows through your heart.  Transesophageal echocardiogram (TEE).  Cardiac monitoring. This allows your health care provider to monitor your heart rate and rhythm in real time.  Holter monitor. This is a portable device that records your heartbeat and can help diagnose heart arrhythmias. It allows your health care provider to track your heart activity for several days, if needed.  Stress tests by exercise or by giving medicine that  makes the heart beat faster. TREATMENT  Treatment of palpitations depends on the cause of your symptoms and can vary greatly. Most cases of palpitations do not require any treatment other than time, relaxation, and monitoring your symptoms. Other causes, such as atrial fibrillation, atrial flutter, or supraventricular tachycardia, usually require further treatment. HOME CARE INSTRUCTIONS   Avoid:  Caffeinated coffee, tea, soft drinks, diet pills, and energy drinks.  Chocolate.  Alcohol.  Stop smoking if you smoke.  Reduce your stress and anxiety. Things that can help you relax include:  A method of controlling things in your body, such as your heartbeats, with your mind (biofeedback).  Yoga.  Meditation.  Physical activity such as swimming, jogging, or walking.  Get plenty of rest and sleep. SEEK MEDICAL CARE IF:   You continue to have a fast or irregular heartbeat beyond 24 hours.  Your palpitations occur more often. SEEK IMMEDIATE MEDICAL CARE IF:  You have chest pain or shortness of breath.  You have a severe headache.  You feel dizzy or you faint. MAKE SURE YOU:  Understand these instructions.  Will watch your condition.  Will get help right away if you are not doing well or get worse.   This information is not intended to replace advice given to you by your health care provider. Make sure you discuss any questions you have with your health care provider.   Document Released: 01/29/2000 Document Revised: 02/05/2013 Document Reviewed: 04/01/2011 Elsevier Interactive Patient Education [employer]2016 Elsevier Inc.

## 2015-06-14 NOTE — ED Notes (Signed)
Pt reports onset last night of palpitations and felt like HR was irregular. Has mild pain to right arm. Hx of afib. No acute distress noted at triage, airway intact.

## 2015-06-14 NOTE — ED Provider Notes (Signed)
CSN: [id number]     Arrival date & time 06/14/15  1503 History   First MD Initiated Contact with Patient 06/14/15 1825     Chief Complaint  Patient presents with  . Atrial Fibrillation  . Chest Pain     Patient is a 70 y.o. female presenting with atrial fibrillation and chest pain. The history is provided by the patient. No language interpreter was used.  Atrial Fibrillation Associated symptoms include chest pain.  Chest Pain  Heidi Chase is a 70 y.o. female who presents to the Emergency Department complaining of palpitations.  This morning she developed a bubbling feeling in her left upper chest that feels like her prior episode of atrial fibrillation that happened about 15 years ago. She had mild discomfort in her chest at that time. Her symptoms have since resolved. She denies any associated fevers, cough, shortness of breath, belly pain, vomiting, diaphoresis, leg swelling or pain. She sees Dr. Graciela HusbandsKlein with cardiology.  Past Medical History  Diagnosis Date  . Hyperlipidemia   . Fatty liver   . SVT (supraventricular tachycardia) (HCC)   . Uterine hyperplasia   . Positive H. pylori test 05/2003    positive x 2  . Diabetes mellitus   . Atrial fibrillation (HCC)   . Elevated LFTs   . IBS (irritable bowel syndrome)   . GERD (gastroesophageal reflux disease)   . Diverticulosis   . Anxiety   . VAIN I (vaginal intraepithelial neoplasia grade I)    Past Surgical History  Procedure Laterality Date  . Rotator cuff repair  10/2000    left  . Dilation and curettage, diagnostic / therapeutic    . Tonsillectomy    . Laparoscopy  1974  . Thumb surgery  10/2000  . Breast biopsy      left  . Pr denies breast biopsy    . Partial hysterectomy - per pt    . Repair rectocele    . Pelvic laparoscopy    . Vaginal hysterectomy  03/23/04  . Colposcopy  2007   Family History  Problem Relation Age of Onset  . Heart disease Father   . Colon cancer Father [id number]  . Hypertension Father   . Heart  attack Mother   . Diabetes Mother   . Hypertension Mother   . Colon cancer Mother [id number]  . Heart disease Mother   . Heart attack Maternal Grandfather   . Heart attack Maternal Grandmother   . Heart attack Paternal Grandfather   . Heart disease Sister   . Diabetes Sister   . Hypertension Sister   . Cancer Sister     UTERINE  . Uterine cancer Sister   . Cancer Sister     UTERINE  . Colon cancer Brother 1160   Social History  Substance Use Topics  . Smoking status: Never Smoker   . Smokeless tobacco: Never Used  . Alcohol Use: No   OB History    Gravida Para Term Preterm AB TAB SAB Ectopic Multiple Living   2 2 2       2      Review of Systems  Cardiovascular: Positive for chest pain.  All other systems reviewed and are negative.     Allergies  Review of patient's allergies indicates no known allergies.  Home Medications   Prior to Admission medications   Medication Sig Start Date End Date Taking? Authorizing Provider  ALPRAZolam (XANAX) 0.25 MG tablet Take 0.25 mg by mouth 2 (two) times daily as  needed for anxiety.    Yes Historical Provider, MD  diphenhydrAMINE (BENADRYL) 25 MG tablet Take 25 mg by mouth as needed for sleep.    Yes Historical Provider, MD  ibuprofen (ADVIL,MOTRIN) 200 MG tablet Take 400 mg by mouth every 6 (six) hours as needed for headache or moderate pain.   Yes Historical Provider, MD  metFORMIN (GLUCOPHAGE-XR) 500 MG 24 hr tablet Take 500 mg by mouth 2 (two) times daily.     Yes Historical Provider, MD  metoprolol (TOPROL-XL) 50 MG 24 hr tablet Take 25 mg by mouth daily.    Yes Historical Provider, MD  NONFORMULARY OR COMPOUNDED ITEM Estradiol .02% 1 ML Prefilled Applicator Sig: apply vaginally twice a week #90 Day Supply with 4 refills 07/23/14  Yes Ok Edwards, MD  ranitidine (ZANTAC) 150 MG tablet Take 150 mg by mouth daily as needed for heartburn.  04/23/15  Yes Historical Provider, MD  sertraline (ZOLOFT) 50 MG tablet Take 50 mg by mouth daily.     Yes Historical Provider, MD  diazepam (VALIUM) 2 MG tablet Take 1 tablet (2 mg total) by mouth 2 (two) times daily as needed. 10/12/12   Hart Carwin, MD  Linaclotide Karlene Einstein) 145 MCG CAPS capsule Take 1 capsule (145 mcg total) by mouth daily. 07/23/14   Ok Edwards, MD   BP 141/79 mmHg  Pulse 59  Temp(Src) 98.6 F (37 C) (Oral)  Resp 15  SpO2 99% Physical Exam  Constitutional: She is oriented to person, place, and time. She appears well-developed and well-nourished.  HENT:  Head: Normocephalic and atraumatic.  Cardiovascular: Normal rate and regular rhythm.   No murmur heard. Pulmonary/Chest: Effort normal and breath sounds normal. No respiratory distress.  Abdominal: Soft. There is no tenderness. There is no rebound and no guarding.  Musculoskeletal: She exhibits no edema or tenderness.  Neurological: She is alert and oriented to person, place, and time.  Skin: Skin is warm and dry.  Psychiatric: She has a normal mood and affect. Her behavior is normal.  Nursing note and vitals reviewed.   ED Course  Procedures (including critical care time) Labs Review Labs Reviewed  BASIC METABOLIC PANEL - Abnormal; Notable for the following:    CO2 21 (*)    All other components within normal limits  CBC - Abnormal; Notable for the following:    RBC 5.28 (*)    Hemoglobin 15.7 (*)    HCT 46.3 (*)    All other components within normal limits  I-STAT TROPOININ, ED  I-STAT TROPOININ, ED    Imaging Review Dg Chest 2 View  06/14/2015  CLINICAL DATA:  Pt reports onset last night of palpitations and felt like HR was irregular. Has mild pain to right arm. EXAM: CHEST  2 VIEW COMPARISON:  03/18/2004 FINDINGS: The heart size and mediastinal contours are within normal limits. Both lungs are clear. The visualized skeletal structures are unremarkable. IMPRESSION: No active cardiopulmonary disease. Electronically Signed   By: Elige Ko   On: 06/14/2015 15:44   I have personally reviewed  and evaluated these images and lab results as part of my medical decision-making.   EKG Interpretation   Date/Time:  Sunday June 14 2015 18:36:43 EDT Ventricular Rate:  58 PR Interval:  168 QRS Duration: 100 QT Interval:  450 QTC Calculation: 442 R Axis:   64 Text Interpretation:  Sinus rhythm Low voltage, precordial leads Confirmed  by Lincoln Brigham [phone]) on 06/14/2015 6:42:54 PM  MDM   Final diagnoses:  Intermittent palpitations    Patient here for intermittent palpitations that occurred prior to ED arrival. She has no palpitations in the emergency department. Presentation is not consistent with ACS, PE, dissection. She is in a sinus rhythm in the department. Offered patient reassurance. Discussed home care, outpatient follow-up, return precautions. Recommend follow-up with Dr. Graciela Husbands with cardiology.    Tilden Fossa, MD 06/15/15 0002

## 2015-06-23 ENCOUNTER — Other Ambulatory Visit: Payer: Self-pay | Admitting: Physician Assistant

## 2015-06-23 ENCOUNTER — Ambulatory Visit (INDEPENDENT_AMBULATORY_CARE_PROVIDER_SITE_OTHER): Payer: Medicare Other | Admitting: Physician Assistant

## 2015-06-23 ENCOUNTER — Encounter: Payer: Self-pay | Admitting: Physician Assistant

## 2015-06-23 ENCOUNTER — Ambulatory Visit (INDEPENDENT_AMBULATORY_CARE_PROVIDER_SITE_OTHER): Payer: Medicare Other

## 2015-06-23 VITALS — BP 104/70 | HR 55 | Ht 63.0 in | Wt 134.6 lb

## 2015-06-23 DIAGNOSIS — R55 Syncope and collapse: Secondary | ICD-10-CM

## 2015-06-23 DIAGNOSIS — R002 Palpitations: Secondary | ICD-10-CM | POA: Diagnosis not present

## 2015-06-23 NOTE — Progress Notes (Signed)
Cardiology Office Note   Date:  06/23/2015   ID:  Heidi Chase, DOB 04/20/1945, MRN [id number]  PCP:  Garlan Fillers, MD  Cardiologist:  Dr Graciela Husbands (2015) Marycatherine Maniscalco, [place], [place]   History of Present Illness: Heidi Chase is a 70 y.o. female with a history of atrial fib, SVT, HLD, GERD, not anticoagulated, chest pain w/ nl cath 2008, nl MV 2015  Seen in ER 06/14/2015 for palpitations, SR on ECG, f/u w/ cards  Heidi Chase presents for evaluation of palpitations.  Heidi Chase has palpitations on a regular basis. Most of them are very brief. She will swallow or take a deep breath and they will resolve. Her ER visit was marked by an episode of palpitations that started at rest, and was longer in duration than usual. She became lightheaded and a little dizzy, she was also short of breath. She had no chest pain. Her heart was going very fast, and she stated it would go faster at sometimes and slower bed, then go fast again. She is not sure of the duration. Her symptoms had resolved by the time she reached the emergency room. She has had some more brief palpitations since then, but nothing that lasted a significant period of time.  She remembers the ablation very negatively, but states that she would consider it if it were necessary. Per her 2005 discharge summary: she hadmultiple atrial arrhythmias, two flutters with different atrial activationsequence, P waves, irregular rhythm and atrial fibrillation.  She is active on a regular basis without any difficulty. She denies any new dyspnea on exertion and states that she did well the other day moving multiple boxes. She did not have chest pain or shortness of breath with this.   Past Medical History  Diagnosis Date  . Hyperlipidemia   . Fatty liver   . SVT (supraventricular tachycardia) (HCC)   . Uterine hyperplasia   . Positive H. pylori test 05/2003    positive x 2  . Diabetes mellitus   . Atrial fibrillation (HCC)   . Elevated LFTs    . IBS (irritable bowel syndrome)   . GERD (gastroesophageal reflux disease)   . Diverticulosis   . Anxiety   . VAIN I (vaginal intraepithelial neoplasia grade I)     Past Surgical History  Procedure Laterality Date  . Rotator cuff repair  10/2000    left  . Dilation and curettage, diagnostic / therapeutic    . Tonsillectomy    . Laparoscopy  1974  . Thumb surgery  10/2000  . Breast biopsy      left  . Pr denies breast biopsy    . Repair rectocele    . Pelvic laparoscopy    . Vaginal hysterectomy  03/23/04  . Colposcopy  2007  . Electrophysiologic study  2005    Multiple atrial arrhythmias, 2 flutters with different atrial activationsequence, P waves, irregular rhythm, atrial fibrillation    Current Outpatient Prescriptions  Medication Sig Dispense Refill  . ALPRAZolam (XANAX) 0.25 MG tablet Take 0.25 mg by mouth 2 (two) times daily as needed for anxiety.     . diazepam (VALIUM) 2 MG tablet Take 2 mg by mouth every 12 (twelve) hours as needed for anxiety.    . diphenhydrAMINE (BENADRYL) 25 MG tablet Take 25 mg by mouth as needed for sleep.     Marland Kitchen ibuprofen (ADVIL,MOTRIN) 200 MG tablet Take 400 mg by mouth every 6 (six) hours as needed for headache or moderate pain.    Marland Kitchen  Linaclotide (LINZESS) 145 MCG CAPS capsule Take 1 capsule (145 mcg total) by mouth daily. 30 capsule 11  . metFORMIN (GLUCOPHAGE-XR) 500 MG 24 hr tablet Take 500 mg by mouth 2 (two) times daily.      . metoprolol (TOPROL-XL) 50 MG 24 hr tablet Take 25 mg by mouth daily.     . NONFORMULARY OR COMPOUNDED ITEM Estradiol .02% 1 ML Prefilled Applicator Sig: apply vaginally twice a week #90 Day Supply with 4 refills 1 each 4  . propranolol (INDERAL) 10 MG tablet Take 10 mg by mouth daily as needed. anxiety  0  . ranitidine (ZANTAC) 150 MG tablet Take 150 mg by mouth daily as needed for heartburn.   0  . sertraline (ZOLOFT) 100 MG tablet Take 100 mg by mouth daily.     No current facility-administered medications for  this visit.    Allergies:   Review of patient's allergies indicates no known allergies.    Social History:  The patient  reports that she has never smoked. She has never used smokeless tobacco. She reports that she does not drink alcohol or use illicit drugs.   Family History:  The patient's family history includes Cancer in her sister and sister; Colon cancer (age of onset: 7060) in her brother; Colon cancer (age of onset: 3880) in her father and mother; Diabetes in her mother and sister; Heart attack in her maternal grandfather, maternal grandmother, mother, and paternal grandfather; Heart disease in her father, mother, and sister; Hypertension in her father, mother, and sister; Uterine cancer in her sister.    ROS:  Please see the history of present illness. All other systems are reviewed and negative.    PHYSICAL EXAM: VS:  BP 104/70 mmHg  Pulse 55  Ht 5\' 3"  (1.6 m)  Wt 134 lb 9.6 oz (61.054 kg)  BMI 23.85 kg/m2 , BMI Body mass index is 23.85 kg/(m^2). GEN: Well nourished, well developed, female in no acute distress HEENT: normal for age  Neck: no JVD, no carotid bruit, no masses Cardiac: RRR; no murmur, no rubs, or gallops Respiratory:  clear to auscultation bilaterally, normal work of breathing GI: soft, nontender, nondistended, + BS MS: no deformity or atrophy; no edema; distal pulses are 2+ in all 4 extremities  Skin: warm and dry, no rash Neuro:  Strength and sensation are intact Psych: euthymic mood, full affect   EKG:  EKG is ordered today. The ekg ordered today demonstrates sinus bradycardia, heart rate 55, no significant abnormalities.   Recent Labs: 06/14/2015: BUN 15; Creatinine, Ser 0.94; Hemoglobin 15.7*; Platelets 246; Potassium 4.0; Sodium 141    Lipid Panel    Component Value Date/Time   CHOL 263* 03/23/2007 1642   TRIG 168* 03/23/2007 1642   HDL 34.4* 03/23/2007 1642   CHOLHDL 7.6 CALC 03/23/2007 1642   VLDL 34 03/23/2007 1642   LDLDIRECT 196.8  03/23/2007 1642     Wt Readings from Last 3 Encounters:  06/23/15 134 lb 9.6 oz (61.054 kg)  07/23/14 137 lb (62.143 kg)  11/04/13 134 lb (60.782 kg)     Other studies Reviewed: Additional studies/ records that were reviewed today include: Office notes, hospital records and testing.  ASSESSMENT AND PLAN:  1.  Palpitations, presyncope: Her symptoms are similar to the symptoms that she was having in 2005. Generally, they are fairly well-controlled by her metoprolol. Her blood pressure is on the low side of normal and her heart rate is mildly bradycardic, so no dose change is possible.  We will order an event monitor to determine the type, frequency, and severity of her arrhythmia. She will follow-up with Dr. Graciela Husbands on those results.  2. Anticoagulation: Her CHADS2VASC=4. I discussed anticoagulation with her and stated that if we found atrial fibrillation on her monitor, she would need oral anticoagulation  Current medicines are reviewed at length with the patient today.  The patient does not have concerns regarding medicines.  The following changes have been made:  no change  Labs/ tests ordered today include: Event monitor   Orders Placed This Encounter  Procedures  . EKG 12-Lead     Disposition:   FU with Dr. Graciela Husbands  Signed, Theodore Demark, PA-C  06/23/2015 9:01 AM    [facility] Phone: [phone]; Fax: [phone]  This note was written with the assistance of speech recognition software. Please excuse any transcriptional errors.

## 2015-06-23 NOTE — Patient Instructions (Addendum)
Medication Instructions:   Your physician recommends that you continue on your current medications as directed. Please refer to the Current Medication list given to you today.   If you need a refill on your cardiac medications before your next appointment, please call your pharmacy.  Labwork: NONE ORDER TODAY    Testing/Procedures: IF POSSIBLE TO PUT ON TODAY. Your physician has recommended that you wear an event monitor. Event monitors are medical devices that record the heart's electrical activity. Doctors most often us these monitors to diagnose arrhythmias. Arrhythmias are problems with the speed or rhythm of the heartbeat. The monitor is a small, portable device. You can wear one while you do your normal daily activities. This is usually used to diagnose what is causing palpitations/syncope (passing out).   Follow-Up:  WITH DR Graciela HusbandsKLEIN IN 5 TO 6 WEEKS FOR EVENT  MONITOR FOLLOW UP    Any Other Special Instructions Will Be Listed Below (If Applicable).

## 2015-07-20 ENCOUNTER — Other Ambulatory Visit: Payer: Self-pay | Admitting: Gastroenterology

## 2015-07-24 ENCOUNTER — Ambulatory Visit (INDEPENDENT_AMBULATORY_CARE_PROVIDER_SITE_OTHER): Payer: Medicare Other | Admitting: Gynecology

## 2015-07-24 ENCOUNTER — Encounter: Payer: Self-pay | Admitting: Gynecology

## 2015-07-24 VITALS — BP 132/80 | Ht 64.0 in | Wt 133.8 lb

## 2015-07-24 DIAGNOSIS — Z87411 Personal history of vaginal dysplasia: Secondary | ICD-10-CM

## 2015-07-24 DIAGNOSIS — N816 Rectocele: Secondary | ICD-10-CM

## 2015-07-24 DIAGNOSIS — Z01419 Encounter for gynecological examination (general) (routine) without abnormal findings: Secondary | ICD-10-CM

## 2015-07-24 DIAGNOSIS — N952 Postmenopausal atrophic vaginitis: Secondary | ICD-10-CM

## 2015-07-24 DIAGNOSIS — R896 Abnormal cytological findings in specimens from other organs, systems and tissues: Secondary | ICD-10-CM

## 2015-07-24 DIAGNOSIS — IMO0002 Reserved for concepts with insufficient information to code with codable children: Secondary | ICD-10-CM

## 2015-07-24 DIAGNOSIS — Z79890 Hormone replacement therapy (postmenopausal): Secondary | ICD-10-CM

## 2015-07-24 DIAGNOSIS — M8580 Other specified disorders of bone density and structure, unspecified site: Secondary | ICD-10-CM

## 2015-07-24 NOTE — Addendum Note (Signed)
Addended by: Berna SpareASTILLO, Yareli Carthen A on: 07/24/2015 10:54 AM   Modules accepted: Orders

## 2015-07-24 NOTE — Progress Notes (Signed)
Heidi Chase 1946/01/21 [id number]   History:    70 y.o.  for annual gyn exam with the only complaint of a possible rectocele? Review of her record indicated the following from last visit on 02/13/2014:  Patient was seen for her annual exam on 06/25/2013.She had a vaginal hysterectomy with posterior repair secondary to prolapse in 2006 and had a benign cervix at that time without history of abnormal Pap smears. 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 High-risk HPV not detected The patient underwent a detailed colposcopic evaluation of the external genitalia perineum and perirectal region with no lesions seen. Acetic acid was applied in the vagina. Systematic inspection of the entire vagina demonstrated no lesions. Evidence of vaginal atrophy and friability of the mucosa was evident.  She is currently on vaginal estrogen twice a week and has done well. She does have history of diverticulosis. She had a normal colonoscopy reported in 2014 although she has had history of colon polyps in the past. Her last bone density demonstrated that her lowest T. score was at the left femoral neck with a value of -1.3. FRAX assessment analysis was normal. Patient with no prior history of abnormal Pap smears. She is taking her calcium and vitamin D.  Pap smear 2016 ASCUS negative HPV  Past medical history,surgical history, family history and social history were all reviewed and documented in the EPIC chart.  Gynecologic History No LMP recorded. Patient has had a hysterectomy. Contraception: status post hysterectomy Last Pap: 2016. Results were: ASCUS negative HPV Last mammogram: 2014. Results were: normal  Obstetric History OB History  Gravida Para Term Preterm  AB SAB TAB Ectopic Multiple Living  # Outcome Date GA Lbr Len/2nd Weight Sex Delivery Anes PTL Lv  2 Term           1 Term                ROS: A ROS was performed and pertinent positives and negatives are included in the history.  GENERAL: No fevers or chills. HEENT: No change in vision, no earache, sore throat or sinus congestion. NECK: No pain or stiffness. CARDIOVASCULAR: No chest pain or pressure. No palpitations. PULMONARY: No shortness of breath, cough or wheeze. GASTROINTESTINAL: No abdominal pain, nausea, vomiting or diarrhea, melena or bright red blood per rectum. GENITOURINARY: No urinary frequency, urgency, hesitancy or dysuria. MUSCULOSKELETAL: No joint or muscle pain, no back pain, no recent trauma. DERMATOLOGIC: No rash, no itching, no lesions. ENDOCRINE: No polyuria, polydipsia, no heat or cold intolerance. No recent change in weight. HEMATOLOGICAL: No anemia or easy bruising or bleeding. NEUROLOGIC: No headache, seizures, numbness, tingling or weakness. PSYCHIATRIC: No depression, no loss of interest in normal activity or change in sleep pattern.     Exam: chaperone present  BP 132/80 mmHg  Ht  (1.626 m)  Wt 133 lb 12.8 oz (60.691 kg)  BMI 22.96 kg/m2  Body mass index is 22.96 kg/(m^2).  General appearance : Well developed well nourished female. No acute distress HEENT: Eyes: no retinal hemorrhage or exudates,  Neck supple, trachea midline, no carotid bruits, no thyroidmegaly Lungs: Clear to auscultation, no rhonchi or wheezes, or rib retractions  Heart: Regular rate and rhythm, no murmurs or gallops Breast:Examined in sitting and supine position were symmetrical in appearance, no palpable masses or tenderness,  no skin retraction, no nipple inversion, no nipple discharge, no skin discoloration, no axillary or supraclavicular lymphadenopathy Abdomen: no palpable masses or tenderness, no rebound or guarding Extremities: no edema or skin discoloration  or tenderness  Pelvic:  Bartholin, Urethra, Skene Glands: Within normal limits             Vagina: No gross lesions or discharge  Cervix: Absent  Uterus  absent  Adnexa  Without masses or tenderness  Anus and perineum  normal   Rectovaginal  normal sphincter tone without palpated masses or tenderness             Hemoccult PCP provides     Assessment/Plan:  70 y.o. female for annual exam with history of osteopenia overdue for her bone density study requisition provided for her to schedule here in the office. She also needs to schedule her overdue mammogram. We discussed importance of monthly self breast examination. We discussed importance of calcium vitamin D and weightbearing exercises for osteoporosis prevention. Pap smear was done today with HPV screening. Prescription refill for vaginal estrogen to apply twice a week was provided.   Ok EdwardsFERNANDEZ,Lindy Pennisi H MD, 9:40 AM 07/24/2015

## 2015-07-24 NOTE — Patient Instructions (Signed)

## 2015-07-29 LAB — PAP, TP IMAGING W/ HPV RNA, RFLX HPV TYPE 16,18/45: HPV mRNA, High Risk: NOT DETECTED

## 2015-08-03 ENCOUNTER — Other Ambulatory Visit: Payer: Self-pay

## 2015-08-03 ENCOUNTER — Other Ambulatory Visit: Payer: Self-pay | Admitting: Gynecology

## 2015-08-03 DIAGNOSIS — Z1231 Encounter for screening mammogram for malignant neoplasm of breast: Secondary | ICD-10-CM

## 2015-08-04 NOTE — Progress Notes (Signed)
Patient Care Team: Jarome Matin, MD as PCP - General (Internal Medicine)   HPI  Heidi Chase is a 70 y.o. female Seen in follow-up for palpitations and lightheadedness.  Years ago she underwent EP testing demonstrating multiple atrial arrhythmias apparently  Records and Results Reviewed event recorder demonstrated no symptomatic arrhythmias--PVCs with bigeminy and short runs of atrial tachycardia and PACs were identified.  Myoview 9/15 demonstrated normal LV function and no ischemia  She is under a great deal of psychosocial stress related to living situations    Past Medical History  Diagnosis Date  . Hyperlipidemia   . Fatty liver   . SVT (supraventricular tachycardia) (HCC)   . Uterine hyperplasia   . Positive H. pylori test 05/2003    positive x 2  . Diabetes mellitus   . Atrial fibrillation (HCC)   . Elevated LFTs   . IBS (irritable bowel syndrome)   . GERD (gastroesophageal reflux disease)   . Diverticulosis   . Anxiety   . VAIN I (vaginal intraepithelial neoplasia grade I)     Past Surgical History  Procedure Laterality Date  . Rotator cuff repair  10/2000    left  . Dilation and curettage, diagnostic / therapeutic    . Tonsillectomy    . Laparoscopy  1974  . Thumb surgery  10/2000  . Breast biopsy      left  . Pr denies breast biopsy    . Repair rectocele    . Pelvic laparoscopy    . Vaginal hysterectomy  03/23/04  . Colposcopy  2007  . Electrophysiologic study  2005    Multiple atrial arrhythmias, 2 flutters with different atrial activationsequence, P waves, irregular rhythm, atrial fibrillation    Current Outpatient Prescriptions  Medication Sig Dispense Refill  . ALPRAZolam (XANAX) 0.25 MG tablet Take 0.25 mg by mouth 2 (two) times daily as needed for anxiety.     . diazepam (VALIUM) 2 MG tablet Take 2 mg by mouth every 12 (twelve) hours as needed for anxiety.    . diphenhydrAMINE (BENADRYL) 25 MG tablet Take 25 mg by mouth as needed for  sleep.     Marland Kitchen ibuprofen (ADVIL,MOTRIN) 200 MG tablet Take 400 mg by mouth every 6 (six) hours as needed for headache or moderate pain.    . metFORMIN (GLUCOPHAGE-XR) 500 MG 24 hr tablet Take 500 mg by mouth 2 (two) times daily.      . metoprolol (TOPROL-XL) 50 MG 24 hr tablet Take 25 mg by mouth daily.     . NONFORMULARY OR COMPOUNDED ITEM Estradiol .02% 1 ML Prefilled Applicator Sig: apply vaginally twice a week #90 Day Supply with 4 refills 1 each 4  . propranolol (INDERAL) 10 MG tablet Take 10 mg by mouth daily as needed. anxiety  0  . ranitidine (ZANTAC) 150 MG tablet Take 150 mg by mouth daily as needed for heartburn.   0  . sertraline (ZOLOFT) 100 MG tablet Take 100 mg by mouth daily.     No current facility-administered medications for this visit.    No Known Allergies    Review of Systems negative except from HPI and PMH  Physical Exam BP 114/62 mmHg  Pulse 62  Ht  (1.626 m)  Wt 132 lb 6.4 oz (60.056 kg)  BMI 22.72 kg/m2  SpO2 98% Well developed and well nourished in no acute distress HENT normal E scleral and icterus clear Neck Supple JVP flat; carotids brisk and full  Regular rate and rhythm, no murmurs gallops or rub Soft with active bowel sounds No clubbing cyanosis  Edema Alert and oriented, grossly normal motor and sensory function Skin Warm and Dry  ECG was not obtained today  Assessment and  Plan  Palpitations  Bigeminy  Nonsustained atrial tachycardia  History of inducible atrial arrhythmias   We discussed a variety of options. The first was to do nothing and use when necessary metoprolol. The second was based on the multitude of arrhythmias noted and her history of inducible arrhythmia to use a drug like flecainide. We have come to the third option would just use AliveCor monitor to try to identify the specific mechanism of her palpitations.  She is further encouraged to try to work on stress management

## 2015-08-05 ENCOUNTER — Encounter: Payer: Self-pay | Admitting: Internal Medicine

## 2015-08-05 ENCOUNTER — Ambulatory Visit (INDEPENDENT_AMBULATORY_CARE_PROVIDER_SITE_OTHER): Payer: Medicare Other | Admitting: Internal Medicine

## 2015-08-05 VITALS — BP 114/62 | HR 62 | Ht 64.0 in | Wt 132.4 lb

## 2015-08-05 DIAGNOSIS — I480 Paroxysmal atrial fibrillation: Secondary | ICD-10-CM | POA: Diagnosis not present

## 2015-08-05 DIAGNOSIS — R002 Palpitations: Secondary | ICD-10-CM | POA: Diagnosis not present

## 2015-08-05 NOTE — Patient Instructions (Signed)
Medication Instructions: - Your physician recommends that you continue on your current medications as directed. Please refer to the Current Medication list given to you today.  Labwork: - none  Procedures/Testing: - none  Follow-Up: - Your physician wants you to follow-up in: 1 year with Dr. Graciela HusbandsKlein. You will receive a reminder letter in the mail two months in advance. If you don't receive a letter, please call our office to schedule the follow-up appointment.  Any Additional Special Instructions Will Be Listed Below (If Applicable). - AliveCor app    If you need a refill on your cardiac medications before your next appointment, please call your pharmacy.

## 2015-08-06 ENCOUNTER — Other Ambulatory Visit: Payer: Self-pay | Admitting: Gynecology

## 2015-08-06 ENCOUNTER — Ambulatory Visit (INDEPENDENT_AMBULATORY_CARE_PROVIDER_SITE_OTHER): Payer: Medicare Other

## 2015-08-06 DIAGNOSIS — M8580 Other specified disorders of bone density and structure, unspecified site: Secondary | ICD-10-CM

## 2015-08-06 DIAGNOSIS — M899 Disorder of bone, unspecified: Secondary | ICD-10-CM | POA: Diagnosis not present

## 2015-08-12 ENCOUNTER — Ambulatory Visit (INDEPENDENT_AMBULATORY_CARE_PROVIDER_SITE_OTHER): Payer: Medicare Other | Admitting: Gynecology

## 2015-08-12 ENCOUNTER — Encounter: Payer: Self-pay | Admitting: Gynecology

## 2015-08-12 VITALS — BP 128/76

## 2015-08-12 DIAGNOSIS — R87622 Low grade squamous intraepithelial lesion on cytologic smear of vagina (LGSIL): Secondary | ICD-10-CM | POA: Diagnosis not present

## 2015-08-12 MED ORDER — NONFORMULARY OR COMPOUNDED ITEM: Status: DC

## 2015-08-12 NOTE — Patient Instructions (Signed)
Colposcopy  Colposcopy is a procedure to examine your cervix and vagina, or the area around the outside of your vagina, for abnormalities or signs of disease. The procedure is done using a lighted microscope called a colposcope. Tissue samples may be collected during the colposcopy if your health care provider finds any unusual cells. A colposcopy may be done if a woman has:  · An abnormal Pap test. A Pap test is a medical test done to evaluate cells that are on the surface of the cervix.  · A Pap test result that is suggestive of human papillomavirus (HPV). This virus can cause genital warts and is linked to the development of cervical cancer.  · A sore on her cervix and the results of a Pap test were normal.  · Genital warts on the cervix or in or around the outside of the vagina.  · A mother who took the drug diethylstilbestrol (DES) while pregnant.  · Painful intercourse.  · Vaginal bleeding, especially after sexual intercourse.  LET YOUR HEALTH CARE PROVIDER KNOW ABOUT:  · Any allergies you have.  · All medicines you are taking, including vitamins, herbs, eye drops, creams, and over-the-counter medicines.  · Previous problems you or members of your family have had with the use of anesthetics.  · Any blood disorders you have.  · Previous surgeries you have had.  · Medical conditions you have.  RISKS AND COMPLICATIONS  Generally, a colposcopy is a safe procedure. However, as with any procedure, complications can occur. Possible complications include:  · Bleeding.  · Infection.  · Missed lesions.  BEFORE THE PROCEDURE   · Tell your health care provider if you have your menstrual period. A colposcopy typically is not done during menstruation.  · For 24 hours before the colposcopy, do not:    Douche.    Use tampons.    Use medicines, creams, or suppositories in the vagina.    Have sexual intercourse.  PROCEDURE   During the procedure, you will be lying on your back with your feet in foot rests (stirrups). A warm  metal or plastic instrument (speculum) will be placed in your vagina to keep it open and to allow the health care provider to see the cervix. The colposcope will be placed outside the vagina. It will be used to magnify and examine the cervix, vagina, and the area around the outside of the vagina. A small amount of liquid solution will be placed on the area that is to be viewed. This solution will make it easier to see the abnormal cells. Your health care provider will use tools to suck out mucus and cells from the canal of the cervix. Then he or she will record the location of the abnormal areas.  If a biopsy is done during the procedure, a medicine will usually be given to numb the area (local anesthetic). You may feel mild pain or cramping while the biopsy is done. After the procedure, tissue samples collected during the biopsy will be sent to a lab for analysis.  AFTER THE PROCEDURE   You will be given instructions on when to follow up with your health care provider for your test results. It is important to keep your appointment.     This information is not intended to replace advice given to you by your health care provider. Make sure you discuss any questions you have with your health care provider.     Document Released: 04/23/2002 Document Revised: 10/03/2012 Document Reviewed: 08/30/2012    Elsevier Interactive Patient Education ©2016 [employer]

## 2015-08-12 NOTE — Progress Notes (Signed)
   HPI: Patient is a 70 year old who presented to the office today for follow-up on a recent abnormal Pap smear. Review of patient's record indicated that patient had a vaginal hysterectomy back in 2006 and her Pap smears prior to that have been normal. Her pathology report did not demonstrate any cervical pathology. Her Pap smear results for the past 2 years have been as follows:   2007 VAIN I 2012 normal Pap smear May 2015 LGSIL with negative HPV December 2015 normal Pap smear negative colposcopy 2016 atypical squamous cells of undetermined significance negative HPV Pap smear 2017 LGSIL negative HPV  Patient currently using vaginal estrogen twice a week for vaginal atrophy. Patient otherwise asymptomatic today. She's here for colposcopic evaluation.   ROS: A ROS was performed and pertinent positives and negatives are included in the history.  GENERAL: No fevers or chills. HEENT: No change in vision, no earache, sore throat or sinus congestion. NECK: No pain or stiffness. CARDIOVASCULAR: No chest pain or pressure. No palpitations. PULMONARY: No shortness of breath, cough or wheeze. GASTROINTESTINAL: No abdominal pain, nausea, vomiting or diarrhea, melena or bright red blood per rectum. GENITOURINARY: No urinary frequency, urgency, hesitancy or dysuria. MUSCULOSKELETAL: No joint or muscle pain, no back pain, no recent trauma. DERMATOLOGIC: No rash, no itching, no lesions. ENDOCRINE: No polyuria, polydipsia, no heat or cold intolerance. No recent change in weight. HEMATOLOGICAL: No anemia or easy bruising or bleeding. NEUROLOGIC: No headache, seizures, numbness, tingling or weakness. PSYCHIATRIC: No depression, no loss of interest in normal activity or change in sleep pattern.   PE: Patient underwent a detail colposcopic evaluation today. The external genitalia, perineum and perirectal region were inspected and no lesions were seen. The speculum was introduced into the vagina. Atrophic changes were  noted but no lesions were seen in the vaginal mucosa with a vaginal cuff even after applying acetic acid.   Assessment Plan: 70 year old not sexually active with vaginal atrophy on vaginal estrogen twice a week with recent Pap smear with low-grade squamous intraepithelial lesion negative HPV and negative colposcopy. According to the new [facility] guidelines will follow-up with Pap smear and HPV screen in one year.

## 2015-08-20 ENCOUNTER — Ambulatory Visit
Admission: RE | Admit: 2015-08-20 | Discharge: 2015-08-20 | Disposition: A | Discharge status: Home / Self Care | Payer: Medicare Other | Source: Ambulatory Visit | Attending: Gynecology | Admitting: Gynecology

## 2015-08-20 DIAGNOSIS — Z1231 Encounter for screening mammogram for malignant neoplasm of breast: Secondary | ICD-10-CM

## 2015-11-28 ENCOUNTER — Ambulatory Visit (INDEPENDENT_AMBULATORY_CARE_PROVIDER_SITE_OTHER): Payer: Medicare Other

## 2015-11-28 DIAGNOSIS — Z23 Encounter for immunization: Secondary | ICD-10-CM | POA: Diagnosis not present

## 2016-03-17 ENCOUNTER — Other Ambulatory Visit (INDEPENDENT_AMBULATORY_CARE_PROVIDER_SITE_OTHER): Payer: Self-pay | Admitting: Specialist

## 2016-04-04 NOTE — Telephone Encounter (Signed)
Tramadol refill

## 2016-04-05 NOTE — Telephone Encounter (Signed)
Call rx to pharm

## 2016-05-17 ENCOUNTER — Other Ambulatory Visit: Payer: Self-pay | Admitting: Gynecology

## 2016-05-17 ENCOUNTER — Ambulatory Visit (INDEPENDENT_AMBULATORY_CARE_PROVIDER_SITE_OTHER): Payer: Medicare Other | Admitting: Gynecology

## 2016-05-17 ENCOUNTER — Encounter: Payer: Self-pay | Admitting: Gynecology

## 2016-05-17 ENCOUNTER — Ambulatory Visit (INDEPENDENT_AMBULATORY_CARE_PROVIDER_SITE_OTHER): Payer: Medicare Other

## 2016-05-17 VITALS — BP 112/72 | Ht 64.0 in | Wt 137.6 lb

## 2016-05-17 DIAGNOSIS — N941 Unspecified dyspareunia: Secondary | ICD-10-CM | POA: Diagnosis not present

## 2016-05-17 DIAGNOSIS — R350 Frequency of micturition: Secondary | ICD-10-CM | POA: Diagnosis not present

## 2016-05-17 DIAGNOSIS — R300 Dysuria: Secondary | ICD-10-CM

## 2016-05-17 DIAGNOSIS — R102 Pelvic and perineal pain: Secondary | ICD-10-CM

## 2016-05-17 DIAGNOSIS — N3001 Acute cystitis with hematuria: Secondary | ICD-10-CM

## 2016-05-17 LAB — URINALYSIS W MICROSCOPIC + REFLEX CULTURE
BILIRUBIN URINE: NEGATIVE
Casts: NONE SEEN [LPF]
Crystals: NONE SEEN [HPF]
GLUCOSE, UA: NEGATIVE
Nitrite: POSITIVE — AB
PH: 5 (ref 5.0–8.0)
Specific Gravity, Urine: >1.03 (ref 1.001–1.035)
Yeast: NONE SEEN [HPF]

## 2016-05-17 MED ORDER — SULFAMETHOXAZOLE-TRIMETHOPRIM 800-160 MG PO TABS: 1.0000 | ORAL_TABLET | Freq: Two times a day (BID) | ORAL | 0 refills | Status: DC

## 2016-05-17 MED ORDER — PHENAZOPYRIDINE HCL 200 MG PO TABS: 200.0000 mg | ORAL_TABLET | Freq: Three times a day (TID) | ORAL | 0 refills | Status: DC | PRN

## 2016-05-17 MED ORDER — FLUCONAZOLE 150 MG PO TABS: 150.0000 mg | ORAL_TABLET | Freq: Once | ORAL | 0 refills | Status: AC

## 2016-05-17 MED ORDER — METRONIDAZOLE 500 MG PO TABS: 500.0000 mg | ORAL_TABLET | Freq: Three times a day (TID) | ORAL | 0 refills | Status: DC

## 2016-05-17 MED ORDER — NITROFURANTOIN MONOHYD MACRO 100 MG PO CAPS: 100.0000 mg | ORAL_CAPSULE | Freq: Two times a day (BID) | ORAL | 0 refills | Status: DC

## 2016-05-17 NOTE — Patient Instructions (Addendum)
Urinary Tract Infection, Adult A urinary tract infection (UTI) is an infection of any part of the urinary tract. The urinary tract includes the:  Kidneys.  Ureters.  Bladder.  Urethra. These organs make, store, and get rid of pee (urine) in the body. Follow these instructions at home:  Take over-the-counter and prescription medicines only as told by your doctor.  If you were prescribed an antibiotic medicine, take it as told by your doctor. Do not stop taking the antibiotic even if you start to feel better.  Avoid the following drinks:  Alcohol.  Caffeine.  Tea.  Carbonated drinks.  Drink enough fluid to keep your pee clear or pale yellow.  Keep all follow-up visits as told by your doctor. This is important.  Make sure to:  Empty your bladder often and completely. Do not to hold pee for long periods of time.  Empty your bladder before and after sex.  Wipe from front to back after a bowel movement if you are female. Use each tissue one time when you wipe. Contact a doctor if:  You have back pain.  You have a fever.  You feel sick to your stomach (nauseous).  You throw up (vomit).  Your symptoms do not get better after 3 days.  Your symptoms go away and then come back. Get help right away if:  You have very bad back pain.  You have very bad lower belly (abdominal) pain.  You are throwing up and cannot keep down any medicines or water. This information is not intended to replace advice given to you by your health care provider. Make sure you discuss any questions you have with your health care provider. Document Released: 07/20/2007 Document Revised: 07/09/2015 Document Reviewed: 12/22/2014 Elsevier Interactive Patient Education  2017 [employer] Inc.   Diverticulitis Diverticulitis is inflammation or infection of small pouches in your colon that form when you have a condition called diverticulosis. The pouches in your colon are called diverticula. Your  colon, or large intestine, is where water is absorbed and stool is formed. Complications of diverticulitis can include:  Bleeding.  Severe infection.  Severe pain.  Perforation of your colon.  Obstruction of your colon. What are the causes? Diverticulitis is caused by bacteria. Diverticulitis happens when stool becomes trapped in diverticula. This allows bacteria to grow in the diverticula, which can lead to inflammation and infection. What increases the risk? People with diverticulosis are at risk for diverticulitis. Eating a diet that does not include enough fiber from fruits and vegetables may make diverticulitis more likely to develop. What are the signs or symptoms? Symptoms of diverticulitis may include:  Abdominal pain and tenderness. The pain is normally located on the left side of the abdomen, but may occur in other areas.  Fever and chills.  Bloating.  Cramping.  Nausea.  Vomiting.  Constipation.  Diarrhea.  Blood in your stool. How is this diagnosed? Your health care provider will ask you about your medical history and do a physical exam. You may need to have tests done because many medical conditions can cause the same symptoms as diverticulitis. Tests may include:  Blood tests.  Urine tests.  Imaging tests of the abdomen, including X-rays and CT scans. When your condition is under control, your health care provider may recommend that you have a colonoscopy. A colonoscopy can show how severe your diverticula are and whether something else is causing your symptoms. How is this treated? Most cases of diverticulitis are mild and can be treated  at home. Treatment may include:  Taking over-the-counter pain medicines.  Following a clear liquid diet.  Taking antibiotic medicines by mouth for 7-10 days. More severe cases may be treated at a hospital. Treatment may include:  Not eating or drinking.  Taking prescription pain medicine.  Receiving  antibiotic medicines through an IV tube.  Receiving fluids and nutrition through an IV tube.  Surgery. Follow these instructions at home:  Follow your health care provider's instructions carefully.  Follow a full liquid diet or other diet as directed by your health care provider. After your symptoms improve, your health care provider may tell you to change your diet. He or she may recommend you eat a high-fiber diet. Fruits and vegetables are good sources of fiber. Fiber makes it easier to pass stool.  Take fiber supplements or probiotics as directed by your health care provider.  Only take medicines as directed by your health care provider.  Keep all your follow-up appointments. Contact a health care provider if:  Your pain does not improve.  You have a hard time eating food.  Your bowel movements do not return to normal. Get help right away if:  Your pain becomes worse.  Your symptoms do not get better.  Your symptoms suddenly get worse.  You have a fever.  You have repeated vomiting.  You have bloody or black, tarry stools. This information is not intended to replace advice given to you by your health care provider. Make sure you discuss any questions you have with your health care provider. Document Released: 11/10/2004 Document Revised: 07/09/2015 Document Reviewed: 12/26/2012 Elsevier Interactive Patient Education  2017 [employer].

## 2016-05-17 NOTE — Progress Notes (Addendum)
   Patient is a 71 year old presented to the office complaining of the past few days of dysuria and frequency and lower abdominal discomfort right lower abdomen greater than left. She denied any CVA tenderness, fever, chills, nausea, vomiting or any discharge. She recently has become more sexually active with her partner. She's had a previous history of hysterectomy many years ago with ovarian conservation. Also review of patient's record indicated that in 2009 during the time of colonoscopy she was found to have benign adenomatous polyp. She states she has a bowel movement every other day but has suffered in the past and constipation. She has been passing gas denies any recent fever.  Exam: Back: No CVA tenderness Abdomen: Soft tender suprapubic mainly with some guarding, positive bowel sounds were noted Pelvic: Bartholin urethra Skene glands with atrophic changes Vagina: Mild atrophic changes vaginal cuff intact Bimanual exam tender suprapubically difficult to access adnexa Rectal vaginal exam not done  Urinalysis: 10-20 RBC, 20-40 RBC and many bacteria culture pending  Ultrasound: Absent uterus vaginal cuff negative right and left ovary not seen. No apparent masses seen on the right or left adnexa. Noted on ultrasound dilated bowel lumen in the left adnexa with right and left adnexa and right upper quadrant in left upper quadrant excessive bowel activity with prominent bowel activity. No fluid in the cul-de-sac.    Assessment/plan: #1 urinary tract infection #2 in the event that patient may have early diverticulitis she will be placed on an antibiotic that we'll cover microorganisms that could contribute to this as well as to the urinary tract infection such as Escherichia coli. She is going to be started on Septra DS 1 by mouth twice a day for 7 days with the addition of Flagyl 500 mg 3 times a day for 7 days as well. I've asked her to return back to the office in 72 hours for reexamination.  If in the meantime she develops any fever or abdominal distention or sharp abdominal pain she is to report to the emergency room. I've also recommend she begin taking MiraLAX 1 tablespoon with juice every morning to regulate her bowel movements. Her current dysuria she'll be prescribed Pyridium 200 mg 1 by mouth 3 times a day for 3 days. #3 patient states sometimes when she takes in a by she develops a yeast infection she will be prescribed Diflucan 150 mg to take 1 by mouth when necessary.  Literature information instruction was provided and all the above. Also have written the above instructions and handed to the patient as well.  Greater than 90% time was spent counseling correlating care for this patient as well as evaluation ultrasound time of consultation 30 minutes

## 2016-05-19 LAB — URINE CULTURE

## 2016-05-20 ENCOUNTER — Encounter: Payer: Self-pay | Admitting: Gynecology

## 2016-05-20 ENCOUNTER — Ambulatory Visit (INDEPENDENT_AMBULATORY_CARE_PROVIDER_SITE_OTHER): Payer: Medicare Other | Admitting: Gynecology

## 2016-05-20 VITALS — BP 142/90 | Temp 97.5°F | Ht 64.0 in | Wt 137.4 lb

## 2016-05-20 DIAGNOSIS — R102 Pelvic and perineal pain: Secondary | ICD-10-CM | POA: Diagnosis not present

## 2016-05-20 DIAGNOSIS — N3001 Acute cystitis with hematuria: Secondary | ICD-10-CM

## 2016-05-20 DIAGNOSIS — Z800 Family history of malignant neoplasm of digestive organs: Secondary | ICD-10-CM | POA: Insufficient documentation

## 2016-05-20 LAB — URINALYSIS W MICROSCOPIC + REFLEX CULTURE
BILIRUBIN URINE: NEGATIVE
CASTS: NONE SEEN [LPF]
CRYSTALS: NONE SEEN [HPF]
Glucose, UA: NEGATIVE
KETONES UR: NEGATIVE
Leukocytes, UA: NEGATIVE
NITRITE: NEGATIVE
PROTEIN: NEGATIVE
Specific Gravity, Urine: 1.02 (ref 1.001–1.035)
YEAST: NONE SEEN [HPF]
pH: 6 (ref 5.0–8.0)

## 2016-05-20 NOTE — Progress Notes (Addendum)
   Patient is a 71 year old that presented to the office for follow-up. Patient was seen in the office 3 days ago and her history is as follows:  On 05/17/2016 when she was seen the office she was complaining of the past few days of dysuria and frequency and lower abdominal discomfort right lower abdomen greater than left. She denied any CVA tenderness, fever, chills, nausea, vomiting or any discharge. She recently has become more sexually active with her partner. She's had a previous history of hysterectomy many years ago with ovarian conservation. Also review of patient's record indicated that in 2009 during the time of colonoscopy she was found to have benign adenomatous polyp. She states she has a bowel movement every other day but has suffered in the past and constipation. She has been passing gas denies any recent fever.  Exam: Temperature 97.5 Back: No CVA tenderness Abdomen: Soft nontender no rebound or guarding positive bowel sounds all 4 quadrants Pelvic: Bartholin urethra Skene glands with atrophic changes Vagina: Mild atrophic changes vaginal cuff intact Bimanual exam tender suprapubically difficult to access adnexa Rectal vaginal exam not done  Urinalysis: 10-20 RBC, 20-40 RBC and many bacteria culture pending  Ultrasound: Absent uterus vaginal cuff negative right and left ovary not seen. No apparent masses seen on the right or left adnexa. Noted on ultrasound dilated bowel lumen in the left adnexa with right and left adnexa and right upper quadrant in left upper quadrant excessive bowel activity with prominent bowel activity. No fluid in the cul-de-sac.    Assessment/plan: (05/17/2016) #1 urinary tract infection #2 in the event that patient may have early diverticulitis she will be placed on an antibiotic that we'll cover microorganisms that could contribute to this as well as to the urinary tract infection such as Escherichia coli. She is going to be started on Septra DS 1  by mouth twice a day for 7 days with the addition of Flagyl 500 mg 3 times a day for 7 days as well. I've asked her to return back to the office in 72 hours for reexamination. If in the meantime she develops any fever or abdominal distention or sharp abdominal pain she is to report to the emergency room. I've also recommend she begin taking MiraLAX 1 tablespoon with juice every morning to regulate her bowel movements. Her current dysuria she'll be prescribed Pyridium 200 mg 1 by mouth 3 times a day for 3 days. #3 patient states sometimes when she takes in a by she develops a yeast infection she will be prescribed Diflucan 150 mg to take 1 by mouth when necessary  Her urine culture from 3 days ago demonstrated Escherichia coli sensitive to the Septra that she's currently taking.  She is here 72 hours after initiating the above treatment and states that she feels much better. She has had issues with her bowel movements which she takes MiraLAX. She will continue the above regimen as recommended. On further history taking she has had 2 sisters with colon cancer 1 at the age of 50 and the other one died this year at the age of 19. Dr. Loreta Ave  is her gastroenterologist who did a colonoscopy in 2014 and I'm going to ask patient to follow-up with her next week. Otherwise she scheduled to see me in June for annual exam or when necessary. Of note her urinalysis has improved only few bacteria trace red blood cells and few bacteria.

## 2016-05-20 NOTE — Addendum Note (Signed)
Addended by: Ok Edwards on: 05/20/2016 04:05 PM   Modules accepted: Orders

## 2016-05-22 LAB — URINE CULTURE: Organism ID, Bacteria: NO GROWTH

## 2016-05-23 ENCOUNTER — Telehealth: Payer: Self-pay | Admitting: *Deleted

## 2016-05-23 NOTE — Telephone Encounter (Signed)
Pt asked for copy of urine results faxed to Dr.Mann office from [facility] 05/20/16.

## 2016-06-29 ENCOUNTER — Encounter: Payer: Self-pay | Admitting: Gynecology

## 2016-07-25 ENCOUNTER — Other Ambulatory Visit: Payer: Self-pay | Admitting: Gynecology

## 2016-07-25 ENCOUNTER — Encounter: Payer: Self-pay | Admitting: Gynecology

## 2016-07-25 ENCOUNTER — Ambulatory Visit (INDEPENDENT_AMBULATORY_CARE_PROVIDER_SITE_OTHER): Payer: Medicare Other | Admitting: Gynecology

## 2016-07-25 VITALS — BP 132/80 | Ht 64.0 in | Wt 137.0 lb

## 2016-07-25 DIAGNOSIS — Z01419 Encounter for gynecological examination (general) (routine) without abnormal findings: Secondary | ICD-10-CM

## 2016-07-25 DIAGNOSIS — N952 Postmenopausal atrophic vaginitis: Secondary | ICD-10-CM

## 2016-07-25 DIAGNOSIS — Z79890 Hormone replacement therapy (postmenopausal): Secondary | ICD-10-CM | POA: Diagnosis not present

## 2016-07-25 DIAGNOSIS — N893 Dysplasia of vagina, unspecified: Secondary | ICD-10-CM

## 2016-07-25 DIAGNOSIS — M8580 Other specified disorders of bone density and structure, unspecified site: Secondary | ICD-10-CM

## 2016-07-25 DIAGNOSIS — R350 Frequency of micturition: Secondary | ICD-10-CM

## 2016-07-25 LAB — URINALYSIS W MICROSCOPIC + REFLEX CULTURE
Bilirubin Urine: NEGATIVE
CASTS: NONE SEEN [LPF]
CRYSTALS: NONE SEEN [HPF]
Glucose, UA: NEGATIVE
Hgb urine dipstick: NEGATIVE
KETONES UR: NEGATIVE
Leukocytes, UA: NEGATIVE
Nitrite: NEGATIVE
PROTEIN: NEGATIVE
SPECIFIC GRAVITY, URINE: 1.015 (ref 1.001–1.035)
Yeast: NONE SEEN [HPF]
pH: 6 (ref 5.0–8.0)

## 2016-07-25 MED ORDER — NONFORMULARY OR COMPOUNDED ITEM: 4 refills | Status: DC

## 2016-07-25 NOTE — Addendum Note (Signed)
Addended by: Kem ParkinsonBARNES, Reilly Blades on: 07/25/2016 09:41 AM   Modules accepted: Orders

## 2016-07-25 NOTE — Progress Notes (Addendum)
Heidi Chase 1945-09-12 [id number]   History:    71 y.o.  for annual gyn exam with no complaints today. Review of patient's record indicated she was seen in the office for colposcopic evaluation back in 08/12/2015 as a result of her abnormal Pap smear that year which had demonstrated low-grade squamous epithelial lesion and negative HPV. Patient with prior history of vaginal hysterectomy. 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 Patient is currently on vaginal estrogen twice a week. Patient has history of osteopenia last bone density study in 2017 demonstrated the lowest T score was -1.7 at the left femoral neck with normal Frax analysis. There was a statistically significant decrease of right hip bone mineralization of -9.1%. Patient is currently taking calcium and vitamin D. Her PCP is been doing her blood work. She has history of colon polyps in the past her last colonoscopy was in 2014 and she is on a 5 year recall.  Patient was treated for UTI earlier this year. She was having some mild frequency may no dysuria wanted to have her urine checked today as well.  Past medical history,surgical history, family history and social history were all reviewed and documented in the EPIC chart.  Gynecologic History No LMP recorded. Patient has had a hysterectomy. Contraception: status post hysterectomy Last Pap: See above. Results were: See above Last mammogram: 2017. Results were: normal  Obstetric History OB History  Gravida Para Term Preterm AB Living  2 2 2     2   SAB TAB Ectopic Multiple Live Births               # Outcome Date GA Lbr Len/2nd Weight Sex  Delivery Anes PTL Lv  2 Term           1 Term                ROS: A ROS was performed and pertinent positives and negatives are included in the history.  GENERAL: No fevers or chills. HEENT: No change in vision, no earache, sore throat or sinus congestion. NECK: No pain or stiffness. CARDIOVASCULAR: No chest pain or pressure. No palpitations. PULMONARY: No shortness of breath, cough or wheeze. GASTROINTESTINAL: No abdominal pain, nausea, vomiting or diarrhea, melena or bright red blood per rectum. GENITOURINARY: No urinary frequency, urgency, hesitancy or dysuria. MUSCULOSKELETAL: No joint or muscle pain, no back pain, no recent trauma. DERMATOLOGIC: No rash, no itching, no lesions. ENDOCRINE: No polyuria, polydipsia, no heat or cold intolerance. No recent change in weight. HEMATOLOGICAL: No anemia or easy bruising or bleeding. NEUROLOGIC: No headache, seizures, numbness, tingling or weakness. PSYCHIATRIC: No depression, no loss of interest in normal activity or change in sleep pattern.     Exam: chaperone present  BP 132/80   Ht 5\' 4"  (1.626 m)   Wt 137 lb (62.1 kg)   BMI 23.52 kg/m   Body mass index is 23.52 kg/m.  General appearance : Well developed well nourished female. No acute distress HEENT: Eyes: no retinal hemorrhage or exudates,  Neck supple, trachea midline, no carotid bruits, no thyroidmegaly Lungs: Clear to auscultation, no rhonchi or wheezes, or rib retractions  Heart: Regular rate  and rhythm, no murmurs or gallops Breast:Examined in sitting and supine position were symmetrical in appearance, no palpable masses or tenderness,  no skin retraction, no nipple inversion, no nipple discharge, no skin discoloration, no axillary or supraclavicular lymphadenopathy Abdomen: no palpable masses or tenderness, no rebound or guarding Extremities: no edema or skin discoloration or tenderness  Pelvic:  Bartholin, Urethra, Skene Glands: Within normal limits             Vagina: No  gross lesions or discharge, atrophic changes  Cervix: Absent  Uterus absent  Adnexa  Without masses or tenderness  Anus and perineum  normal   Rectovaginal  normal sphincter tone without palpated masses or tenderness             Hemoccult PCP provides     Assessment/Plan:  71 y.o. female for annual exam with history of LGSIL on Pap smear last year negative HPV negative colposcopy. Pap smear with HPV screening will be done today. PCP doing her blood work. Patient due for bone density study and colonoscopy next year. She was also reminded and provided with a requisition to schedule her mammogram for July. We will check her vitamin D level today because of her history of osteopenia. We discussed importance of calcium vitamin D and weightbearing exercises for osteoporosis prevention. Vaginal estrogen to apply twice a week prescription refill provided. PCP has been doing her blood work.  Urinalysis today negative   Ok Edwards MD, 9:28 AM 07/25/2016

## 2016-07-27 ENCOUNTER — Other Ambulatory Visit: Payer: Self-pay | Admitting: *Deleted

## 2016-07-27 LAB — PAP, TP IMAGING W/ HPV RNA, RFLX HPV TYPE 16,18/45: HPV MRNA, HIGH RISK: NOT DETECTED

## 2016-07-27 LAB — URINE CULTURE

## 2016-07-27 MED ORDER — CIPROFLOXACIN HCL 250 MG PO TABS: 250.0000 mg | ORAL_TABLET | Freq: Two times a day (BID) | ORAL | 0 refills | Status: DC

## 2016-10-14 ENCOUNTER — Ambulatory Visit: Payer: Medicare Other | Admitting: Internal Medicine

## 2016-10-19 ENCOUNTER — Ambulatory Visit
Admission: RE | Admit: 2016-10-19 | Discharge: 2016-10-19 | Disposition: A | Discharge status: Home / Self Care | Payer: Medicare Other | Source: Ambulatory Visit | Attending: Gastroenterology | Admitting: Gastroenterology

## 2016-10-19 ENCOUNTER — Other Ambulatory Visit: Payer: Self-pay | Admitting: Gastroenterology

## 2016-10-19 DIAGNOSIS — R1031 Right lower quadrant pain: Secondary | ICD-10-CM

## 2016-10-19 MED ORDER — IOPAMIDOL (ISOVUE-300) INJECTION 61%: 100.0000 mL | Freq: Once | INTRAVENOUS | Status: DC | PRN

## 2016-10-19 NOTE — Progress Notes (Signed)
Heidi Botkins MD 

## 2016-12-23 ENCOUNTER — Encounter: Payer: Self-pay | Admitting: Internal Medicine

## 2017-01-03 ENCOUNTER — Encounter: Payer: Self-pay | Admitting: Internal Medicine

## 2017-01-03 ENCOUNTER — Ambulatory Visit: Payer: Medicare Other | Admitting: Internal Medicine

## 2017-01-03 VITALS — BP 124/76 | HR 76 | Ht 64.0 in | Wt 137.0 lb

## 2017-01-03 DIAGNOSIS — I480 Paroxysmal atrial fibrillation: Secondary | ICD-10-CM

## 2017-01-03 DIAGNOSIS — R002 Palpitations: Secondary | ICD-10-CM | POA: Diagnosis not present

## 2017-01-03 NOTE — Progress Notes (Signed)
Patient Care Team: Jarome MatinPaterson, Daniel, MD as PCP - General (Internal Medicine)   HPI  Heidi Chase is a 71 y.o. female Seen in follow-up for palpitations and lightheadedness.  Years ago she underwent EP testing demonstrating multiple atrial arrhythmias apparently  Records and Results Reviewed event recorder demonstrated no symptomatic arrhythmias--PVCs with bigeminy and short runs of atrial tachycardia and PACs were identified.  Myoview 9/15 demonstrated normal LV function and no ischemia  She is under a great deal of psychosocial stress and is her impression that her arrhythmias are aggravated by stress.  She is trying to wean herself off of the beta-blocker it is gone from metoprolol 50--25--12.5.  She never did use the ALIVECOR monitor      Past Medical History:  Diagnosis Date  . Anxiety   . Atrial fibrillation (HCC)   . Diabetes mellitus   . Diverticulosis   . Elevated LFTs   . Fatty liver   . GERD (gastroesophageal reflux disease)   . Hyperlipidemia   . IBS (irritable bowel syndrome)   . Positive H. pylori test 05/2003   positive x 2  . SVT (supraventricular tachycardia) (HCC)   . Uterine hyperplasia   . VAIN I (vaginal intraepithelial neoplasia grade I)     Past Surgical History:  Procedure Laterality Date  . BREAST BIOPSY     left  . COLPOSCOPY  2007  . DILATION AND CURETTAGE, DIAGNOSTIC / THERAPEUTIC    . ELECTROPHYSIOLOGIC STUDY  2005   Multiple atrial arrhythmias, 2 flutters with different atrial activationsequence, P waves, irregular rhythm, atrial fibrillation  . LAPAROSCOPY  1974  . PELVIC LAPAROSCOPY    . pr denies breast biopsy    . REPAIR RECTOCELE    . ROTATOR CUFF REPAIR  10/2000   left  . thumb surgery  10/2000  . TONSILLECTOMY    . VAGINAL HYSTERECTOMY  03/23/04    Current Outpatient Medications  Medication Sig Dispense Refill  . ALPRAZolam (XANAX) 0.25 MG tablet Take 0.25 mg by mouth 2 (two) times daily as needed for anxiety.     .  diazepam (VALIUM) 2 MG tablet Take 2 mg by mouth every 12 (twelve) hours as needed for anxiety.    . diphenhydrAMINE (BENADRYL) 25 MG tablet Take 25 mg by mouth as needed for sleep.     Marland Kitchen. ibuprofen (ADVIL,MOTRIN) 200 MG tablet Take 400 mg by mouth every 6 (six) hours as needed for headache or moderate pain.    . metFORMIN (GLUCOPHAGE-XR) 500 MG 24 hr tablet Take 500 mg by mouth 2 (two) times daily.      . metoprolol (TOPROL-XL) 50 MG 24 hr tablet Take 25 mg by mouth daily.     . NONFORMULARY OR COMPOUNDED ITEM Estradiol .02% 1 ML Prefilled Applicator Sig: apply vaginally twice a week #90 Day Supply with 4 refills 1 each 4  . propranolol (INDERAL) 10 MG tablet Take 10 mg by mouth daily as needed. anxiety  0  . ranitidine (ZANTAC) 150 MG tablet Take 150 mg by mouth daily as needed for heartburn.   0   No current facility-administered medications for this visit.     No Known Allergies    Review of Systems negative except from HPI and PMH  Physical Exam BP 124/76   Pulse 76   Ht 5\' 4"  (1.626 m)   Wt 137 lb (62.1 kg)   SpO2 96%   BMI 23.52 kg/m  Well developed and nourished in no  acute distress HENT normal Neck supple with JVP-flat Clear Regular rate and rhythm, no murmurs or gallops Abd-soft with active BS No Clubbing cyanosis edema Skin-warm and dry A & Oriented  Grossly normal sensory and motor function  ECG sinus at 76 Intervals 14/08/37  Assessment and  Plan  Palpitations  Bigeminy  Nonsustained atrial tachycardia  History of inducible atrial arrhythmias  Stress  She will increase her metoprolol from 12.5--25.  [place]  I have reached out to Piedad ClimesMichael Drucker to help her establish with him.  She is disinclined at this point to begin flecainide.

## 2017-01-10 ENCOUNTER — Telehealth: Payer: Self-pay

## 2017-01-10 ENCOUNTER — Telehealth: Payer: Self-pay | Admitting: Internal Medicine

## 2017-01-10 DIAGNOSIS — R002 Palpitations: Secondary | ICD-10-CM

## 2017-01-10 DIAGNOSIS — I480 Paroxysmal atrial fibrillation: Secondary | ICD-10-CM

## 2017-01-10 NOTE — Telephone Encounter (Signed)
Patient calling, states that she is returning your call.  °

## 2017-01-10 NOTE — Telephone Encounter (Signed)
lmtcb for information for Dr. Gilman ButtnerMike Drucker[place]. Graciela HusbandsKlein wanted the patient to see per OV on 01/03/17

## 2017-01-10 NOTE — Telephone Encounter (Signed)
Pt is aware and agreeable to information for Dr. [place]ucker [place]. Will reach out to Dr. Carmon Ginsbergrucker's office if I need to do a referral via epic or if patient can do a self referral. She is agreeable to me leaving information on her voicemail.

## 2017-02-14 DIAGNOSIS — K8020 Calculus of gallbladder without cholecystitis without obstruction: Secondary | ICD-10-CM

## 2017-02-14 HISTORY — DX: Calculus of gallbladder without cholecystitis without obstruction: K80.20

## 2018-03-01 IMAGING — DX DG CHEST 2V
2 series · 2 of 2 positions shown · non-contrast
Comparison: 03/18/2004

CLINICAL DATA: Pt reports onset last night of palpitations and felt
like HR was irregular. Has mild pain to right arm.

EXAM:
CHEST  2 VIEW

[chest pa]
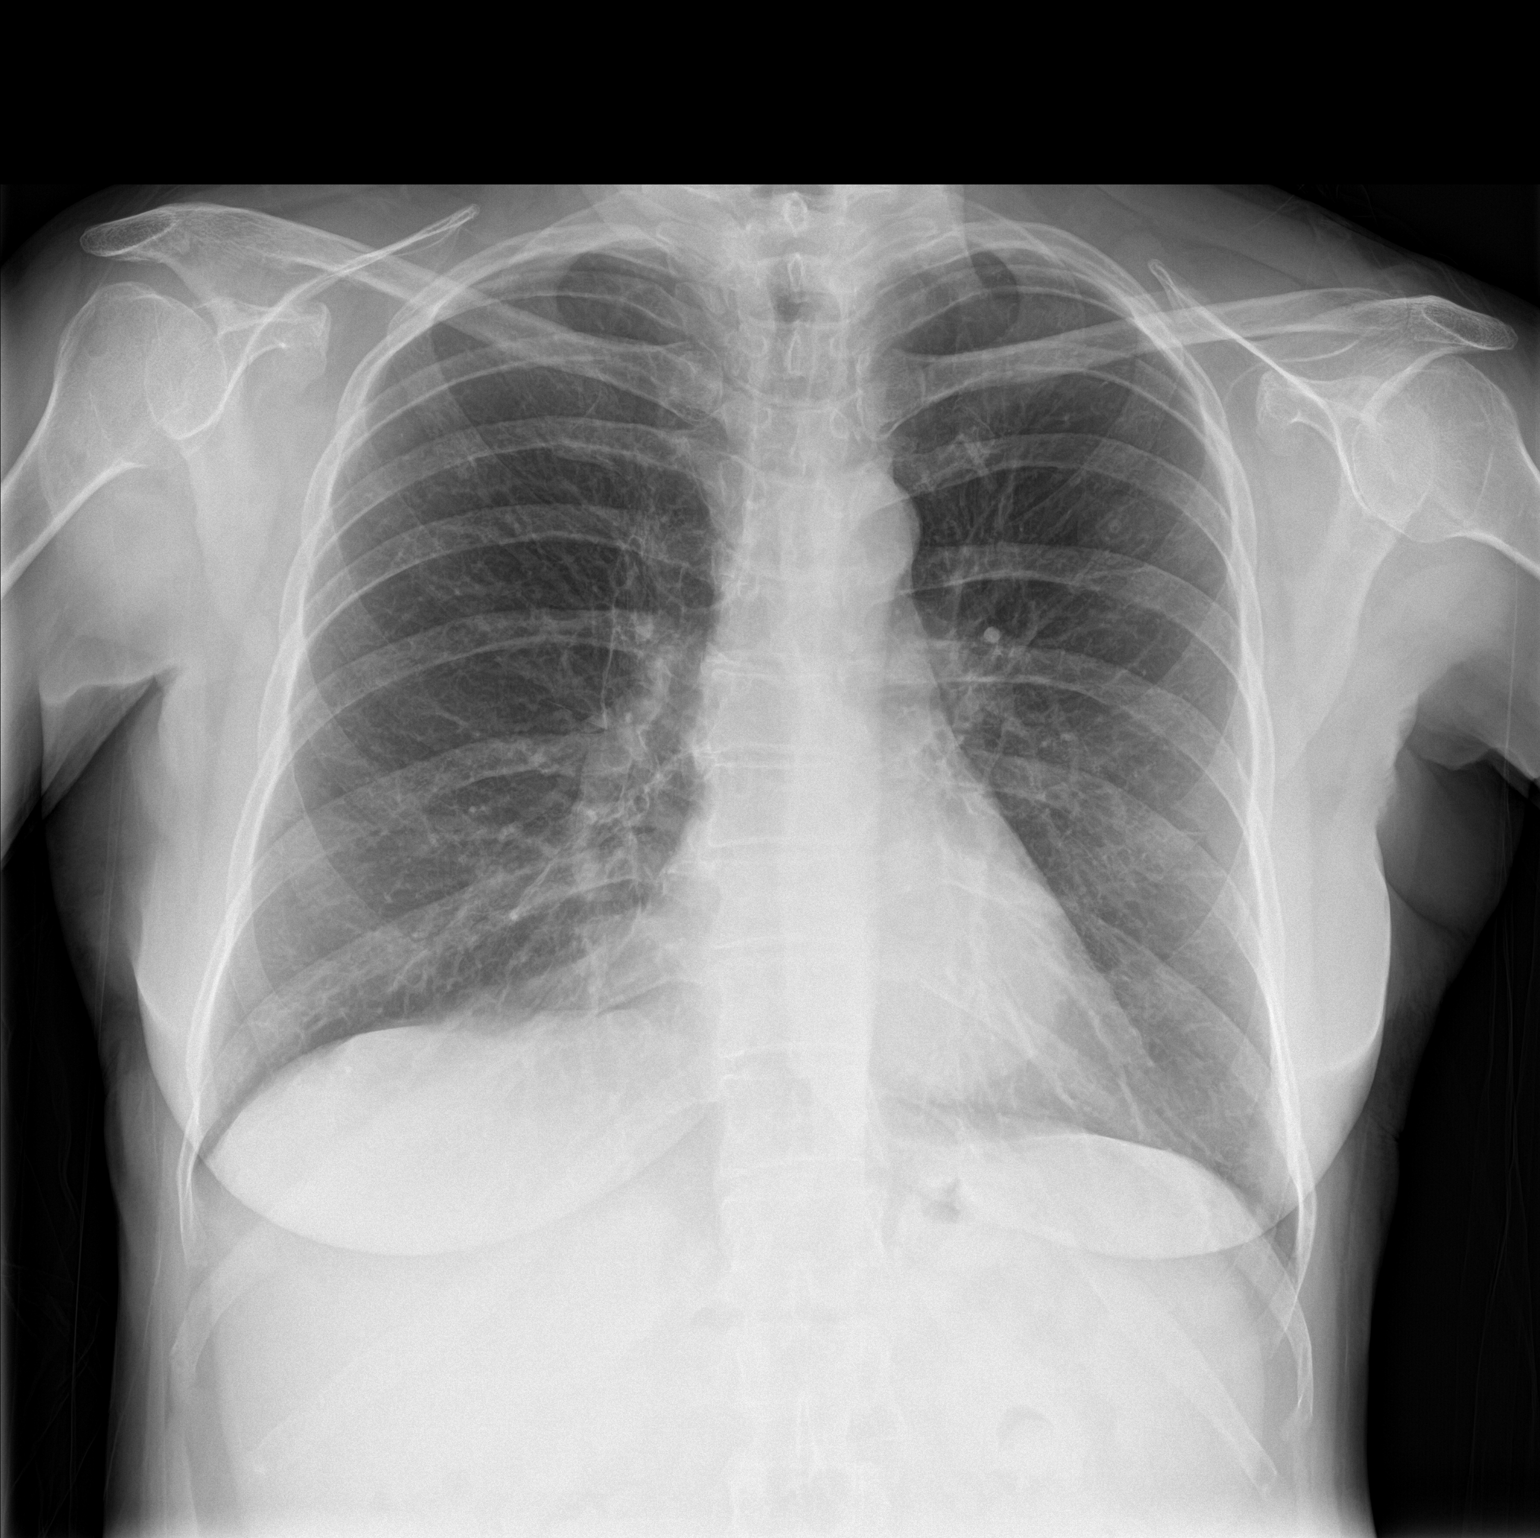

[chest lat]
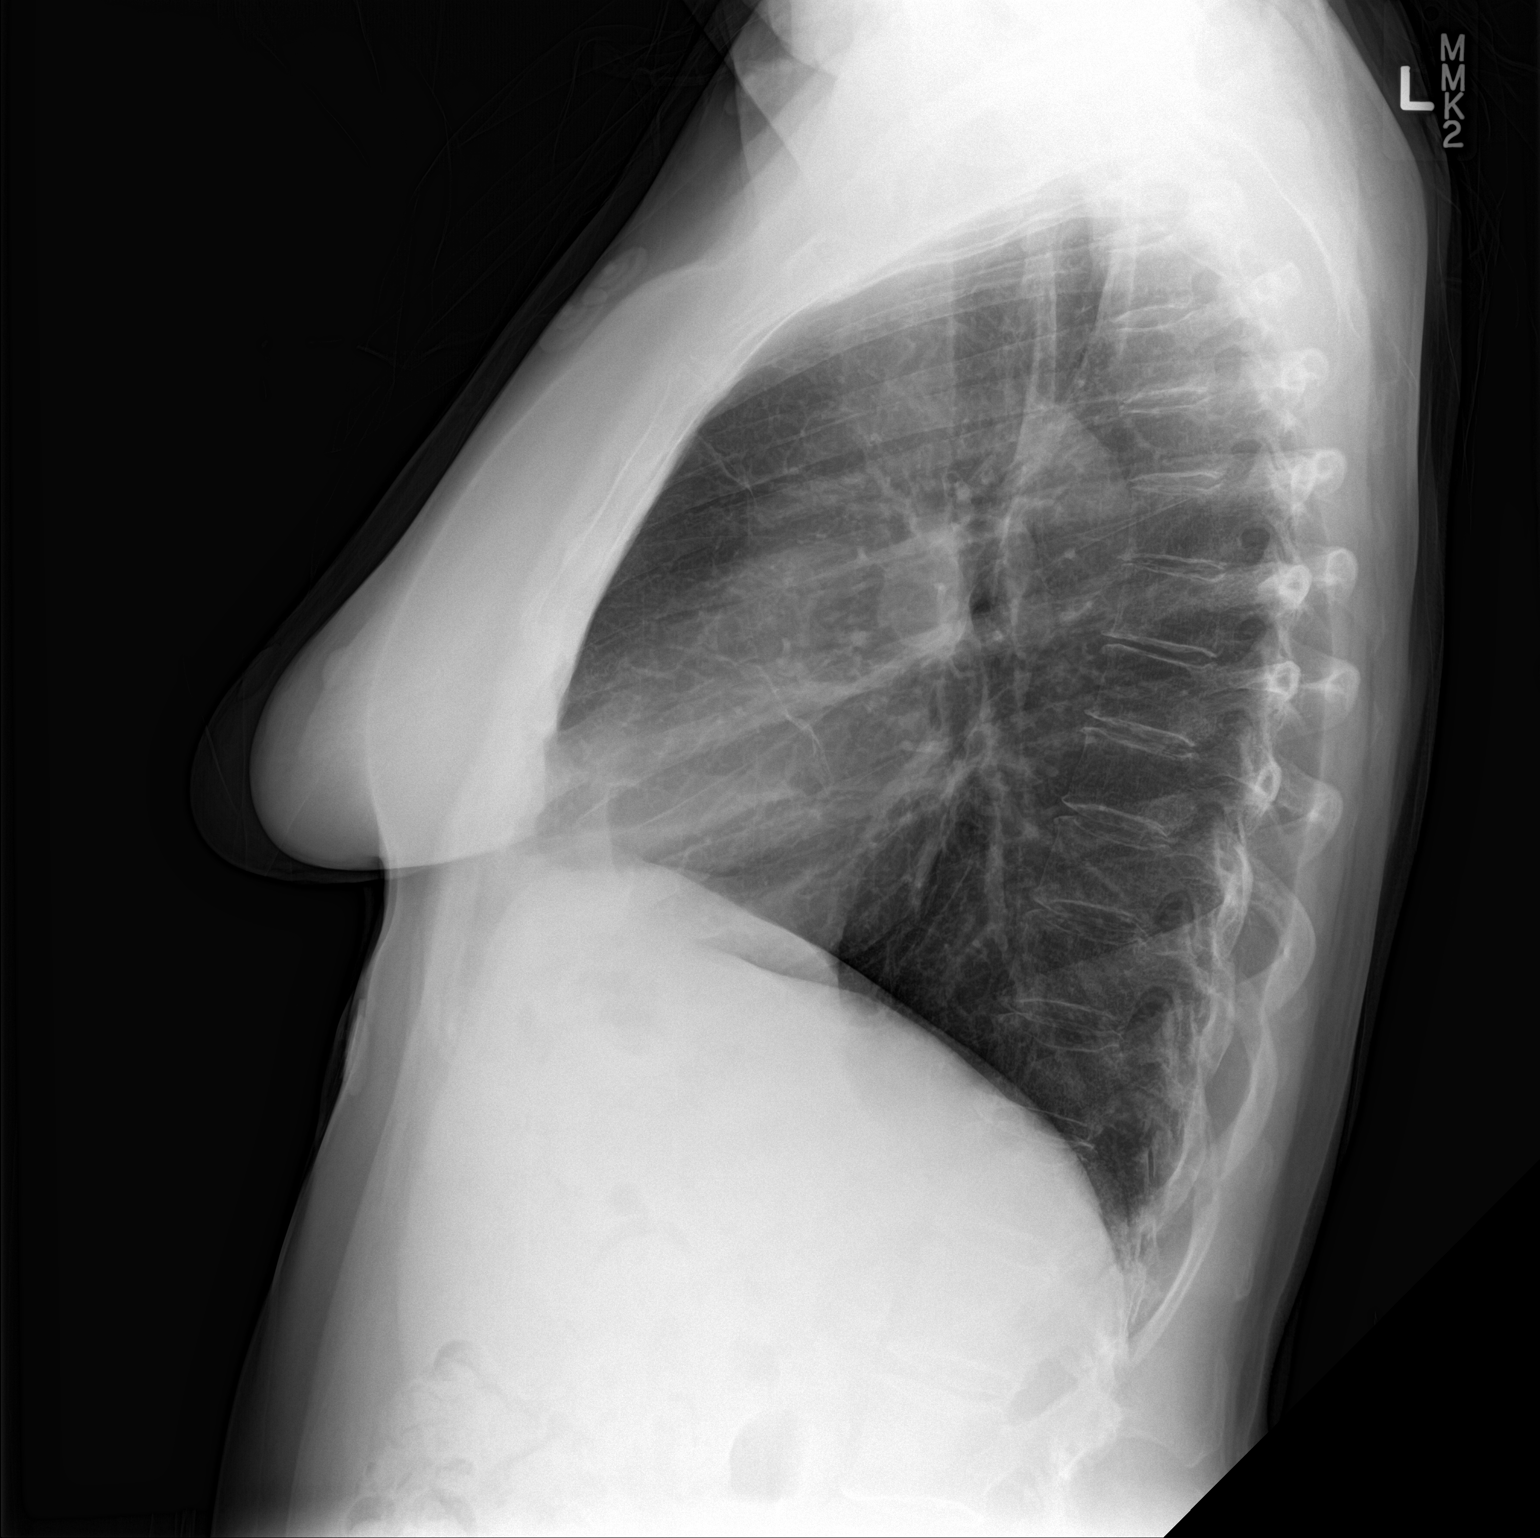

[2 of 2 positions shown; findings below may reference images not displayed]

FINDINGS: The heart size and mediastinal contours are within normal limits.
Both lungs are clear. The visualized skeletal structures are
unremarkable.
IMPRESSION: No active cardiopulmonary disease.

## 2018-07-13 ENCOUNTER — Other Ambulatory Visit: Payer: Self-pay | Admitting: Internal Medicine

## 2018-07-13 DIAGNOSIS — E785 Hyperlipidemia, unspecified: Secondary | ICD-10-CM

## 2018-07-31 ENCOUNTER — Ambulatory Visit
Admission: RE | Admit: 2018-07-31 | Discharge: 2018-07-31 | Disposition: A | Discharge status: Home / Self Care | Payer: Medicare Other | Source: Ambulatory Visit | Attending: Internal Medicine | Admitting: Internal Medicine

## 2018-07-31 DIAGNOSIS — E785 Hyperlipidemia, unspecified: Secondary | ICD-10-CM

## 2019-08-08 ENCOUNTER — Institutional Professional Consult (permissible substitution): Payer: Medicare Other | Admitting: Neurology

## 2019-10-14 NOTE — Progress Notes (Signed)
[facility]    Provider:  Dr Lucia GaskinsAhern Requesting Provider: Jarome MatinPaterson, Daniel, MD Primary Care Provider:  Jarome MatinPaterson, Daniel, MD  CC:  Grover CanavanForgetful  HPI:  Heidi Chase is a 74 y.o. female here as requested by Jarome MatinPaterson, Daniel, MD for forgetfulness. PMHx HLD, SVT, elevated LFTs/fatty liver, SM, afib, anxiety, CAD, .  I reviewed Dr. Norval GablePatterson's notes: In the office in May of this year MMSE was 16 out of 30 with points lost in orientation, counting backwards, was only able to name 2 out of the 3 items she was told, she was never a smoker, she reports that she is becoming more forgetful and she would lose things, also at night she gets agitated, she frequently misplaces things, never gets lost, but she does forget names and repeats questions.  Dr. Jarold MottoPatterson noted moderate impairment in short-term memory, slowly progressive and is most likely from Alzheimer's disease, in the past her vitamin B12 has been normal with supplementation, they did check a B12 level, TSH and RPR to rule out secondary causes of dementia and she was started on Aricept, her poor quality of sleep is chronic and may have obstructive sleep apnea contributing to forgetfulness and difficult to control hypertension.  She also reported snoring and mild daytime fatigue but declined sleep test, she also has chronic insomnia yet still declined to have screening, no stated personal or family history of Alzheimer's in the notes from Dr. Jarold MottoPatterson, examination showed normal cardiovascular, musculoskeletal, neurologic exams but showed reduced short-term memory and mildly anxious mood and affect  Patient is here with her husband who also provides much information.  She has had slowly progressive memory loss and forgetfulness, started worsening about 6 to 8 months ago, she is worried about it and it makes her feel intimidated like she wants to the right thing, she stopped atorvastatin per cardiology for few weeks but that did not help. They  state no concerns prior to 6-8 months ago, husband says she started misplacing pocket book, phone, husband had to put trackers on each one, not in unusual places but under the bed or behind a couch pillow. Wife says she thinks all is fine, its mostly her husband complaints. She is cooking well, not forgetting ingredients and sometimes uses the recipe, the only problem is she is misplacing the same things. No accidents in the car, although husband has always driven more, but if she does drive she doesn't get lost, husband has always managed the finances because he is a computer guy, she has her own checking account a debit card and no confusion or spending money inappropriately. She may ask the same question 2-3 times in the same day/conversatio but not frequently, no hallucinations but every night she hides her pocket book because she is being careful but husband says she may think someone is trying to take it (this just started as she started losing things). Sometimes she acuses her husband maybe of having a girlfriend, she denies it, she says it when she can;t find something and says maybe husband's girlfriend took it (husband seems to think she is being serious and has it in her mind). If he tries to challenge her, she gets very agitated and he feels accused of something. Sleeping well. No waking up or vivid dreams. Husband takes care of everything, prints out appointments, details it for her, manages the finances so hard to say how impaired she is with more short-term memory loss. Mother had dementia, unknown type, mother was 2880s. No  hx of alcohol or drug use or smoking. Denies depression or anxiety or a hx of psychiatric disorders.  Reviewed notes, labs and imaging from outside physicians, which showed;  Labs collected Jun 22, 2018 include CMP with BUN 15 and creatinine 0.8 otherwise unremarkable, TSH 1.42 normal.  hgba1c 6.76 03/2019  CT head 2015: reviewed images, HEAD CT WITH AND WITHOUT CONTRAST    Spiral scanning is performed before and after intravenous contrast administration. The study is done in CT angiographic fashion.   The brain does not show any evidence of stroke, mass, hemorrhage, hydrocephalus, or extraaxial collection. All major circle of Willis branches are patent. Both vertebral arteries are patent to form the basilar artery.   IMPRESSION  Normal head CT.    Review of Systems: Patient complains of symptoms per HPI as well as the following symptoms: forgetful. Pertinent negatives and positives per HPI. All others negative.   Social History   Socioeconomic History  . Marital status: Married    Spouse name: Not on file  . Number of children: Not on file  . Years of education: Not on file  . Highest education level: High school graduate  Occupational History  . Not on file  Tobacco Use  . Smoking status: Never Smoker  . Smokeless tobacco: Never Used  Vaping Use  . Vaping Use: Never used  Substance and Sexual Activity  . Alcohol use: No    Alcohol/week: 0.0 standard drinks  . Drug use: No  . Sexual activity: Yes    Birth control/protection: Post-menopausal, Surgical    Comment: intercourse age 18, sexual partners less than 5  Other Topics Concern  . Not on file  Social History Narrative   Lives at home with husband   Right handed   Caffeine: "some", maybe two in a week   Social Determinants of Health   Financial Resource Strain:   . Difficulty of Paying Living Expenses: Not on file  Food Insecurity:   . Worried About Programme researcher, broadcasting/film/video in the Last Year: Not on file  . Ran Out of Food in the Last Year: Not on file  Transportation Needs:   . Lack of Transportation (Medical): Not on file  . Lack of Transportation (Non-Medical): Not on file  Physical Activity:   . Days of Exercise per Week: Not on file  . Minutes of Exercise per Session: Not on file  Stress:   . Feeling of Stress : Not on file  Social Connections:   . Frequency of Communication  with Friends and Family: Not on file  . Frequency of Social Gatherings with Friends and Family: Not on file  . Attends Religious Services: Not on file  . Active Member of Clubs or Organizations: Not on file  . Attends Banker Meetings: Not on file  . Marital Status: Not on file  Intimate Partner Violence:   . Fear of Current or Ex-Partner: Not on file  . Emotionally Abused: Not on file  . Physically Abused: Not on file  . Sexually Abused: Not on file    Family History  Problem Relation Age of Onset  . Heart disease Father   . Colon cancer Father 94  . Hypertension Father   . Heart attack Mother   . Diabetes Mother   . Hypertension Mother   . Colon cancer Mother 34  . Heart disease Mother   . Dementia Mother   . Colon cancer Brother 29  . Heart attack Maternal Grandfather   .  Heart attack Maternal Grandmother   . Heart attack Paternal Grandfather   . Heart disease Sister   . Diabetes Sister   . Hypertension Sister   . Cancer Sister        UTERINE  . Uterine cancer Sister   . Cancer Sister        UTERINE    Past Medical History:  Diagnosis Date  . Anxiety   . Atrial fibrillation (HCC)   . Atypical chest pain    with Lexiscan test normal, with LV Efx 74%  . CAD (coronary artery disease)    CT coronary calcium study showed 3 vessel CAD (86 percentile), had 10/2018 stress echo showing normal LV Efx and poor exercise intolerance, started on Atorvastatin by cards  . Chronic insomnia    with snoring, sleep study recommended, in 2019 declined to have screening nocturnal pulse oximetry study  . Diabetes mellitus   . Diverticulosis   . Elevated LFTs   . Fatty liver   . Gallstones 02/2017   without obstruction or infection. nuclear medicine hepatobiliary study in December 2019 showed normal gallbladder function  . GERD (gastroesophageal reflux disease)   . Hyperlipidemia   . IBS (irritable bowel syndrome)   . Positive H. pylori test 05/2003   positive x 2  .  SVT (supraventricular tachycardia) (HCC)   . Uterine hyperplasia   . VAIN I (vaginal intraepithelial neoplasia grade I)     Patient Active Problem List   Diagnosis Date Noted  . Family history of colon cancer 05/20/2016  . LGSIL Pap smear of vagina 08/12/2015  . Osteopenia 07/31/2012  . Vaginal atrophy 07/31/2012  . CHEST PAIN, NON-CARDIAC 06/20/2008  . ATRIAL FIBRILLATION, HX OF 06/20/2008    Past Surgical History:  Procedure Laterality Date  . BREAST BIOPSY  03/1999   left  . CARDIAC CATHETERIZATION  2008   without significant CAD  . COLONOSCOPY  2017   showed sigmoid diverticulosis with no polyps (f/u in 2022)  . COLPOSCOPY  2007  . DILATION AND CURETTAGE, DIAGNOSTIC / THERAPEUTIC    . ELECTROPHYSIOLOGIC STUDY  2005   Multiple atrial arrhythmias, 2 flutters with different atrial activationsequence, P waves, irregular rhythm, atrial fibrillation  . ESOPHAGOGASTRODUODENOSCOPY  11/2012   showed mild gastritis   . LAPAROSCOPY  1974  . LIVER BIOPSY  03/1999  . PELVIC LAPAROSCOPY    . pr denies breast biopsy    . REPAIR RECTOCELE    . ROTATOR CUFF REPAIR  10/2000   left  . thumb surgery  10/2000  . TONSILLECTOMY    . VAGINAL HYSTERECTOMY  03/23/04    Current Outpatient Medications  Medication Sig Dispense Refill  . atorvastatin (LIPITOR) 40 MG tablet Take by mouth. Take 40 mg one day and 20 mg the next day (alternating)    . diphenhydrAMINE (BENADRYL) 25 MG tablet Take 25 mg by mouth as needed for sleep.     Marland Kitchen donepezil (ARICEPT) 5 MG tablet SMARTSIG:1 Tablet(s) By Mouth Every Evening    . metFORMIN (GLUCOPHAGE-XR) 500 MG 24 hr tablet Take 1,000 mg by mouth 2 (two) times daily.     . metoprolol (TOPROL-XL) 50 MG 24 hr tablet Take 50 mg by mouth daily.     . clotrimazole (GYNE-LOTRIMIN) 1 % vaginal cream Place 1 Applicatorful vaginally as needed. (Patient not taking: Reported on 10/15/2019)     No current facility-administered medications for this visit.    Allergies as  of 10/15/2019  . (No Known Allergies)  Vitals: BP 131/82 (BP Location: Right Arm, Patient Position: Sitting)   Pulse 73   Ht 5\' 4"  (1.626 m)   Wt 125 lb (56.7 kg)   BMI 21.46 kg/m  Last Weight:  Wt Readings from Last 1 Encounters:  10/15/19 125 lb (56.7 kg)   Last Height:   Ht Readings from Last 1 Encounters:  10/15/19 5\' 4"  (1.626 m)     Physical exam: Exam: Gen: NAD, THIN                 CV: RRR, no MRG. No Carotid Bruits. No peripheral edema, warm, nontender Eyes: Conjunctivae clear without exudates or hemorrhage  Neuro: Detailed Neurologic Exam  Speech:    Speech is normal; fluent and spontaneous with normal comprehension.  Cognition:  MMSE - Mini Mental State Exam 10/15/2019  Orientation to time 4  Orientation to Place 3  Registration 3  Attention/ Calculation 0  Recall 0  Language- name 2 objects 2  Language- repeat 1  Language- follow 3 step command 3  Language- read & follow direction 1  Write a sentence 1  Copy design 0  Total score 18     Cranial Nerves:    The pupils are equal, round, and reactive to light. COULDN'T EVALUATE FUNDI DUE TO SMALL PUPILS. Visual fields are full to finger confrontation. Extraocular movements are intact. Trigeminal sensation is intact and the muscles of mastication are normal. The face is symmetric. The palate elevates in the midline. Hearing intact. Voice is normal. Shoulder shrug is normal. The tongue has normal motion without fasciculations.   Coordination:    Normal finger to nose and heel to shin.   Gait:    Heel-toe and tandem gait are normal.   Motor Observation:    No asymmetry, no atrophy, and no involuntary movements noted. Tone:    Normal muscle tone.    Posture:    Posture is normal. normal erect    Strength:    Strength is V/V in the upper and lower limbs.      Sensation: intact to LT     Reflex Exam:  DTR's:    Absent AJs. Otherwise Deep tendon reflexes in the upper and lower extremities  are 2+ patellars and biceps bilaterally.   Toes:    The toes are downgoing bilaterally.   Clonus:    Clonus is absent.    Assessment/Plan: 74 year old who  is losing things more often maybe even hiding things, has accused husband of having a girlfriend and she gets agitated if he pushes back. When she can't find things she starts accusing husband. More irritability . Some short-term memory problems and occasionally asking same questions in the same conversations. He recorded her swearing at him and telling him things she has never said and being extremely agitated, not violent but almost, and she cries and gets angry. No waking up or vivid dreams. Husband takes care of everything, prints out appointments, details schedule for her, manages the finances so it is hard to say how impaired she is with more short-term memory loss, husband always drives ongoing 4 years since they married.  Mother had dementia, unknown type, mother was 9s. No hx of alcohol or drug use or smoking. Denies depression or anxiety or a hx of psychiatric disorders.  I do think patient has at least moderate cognitive disability given some of the symptoms and the Mini-Mental status exam 18 out of 22, it appears husband has always done the bills, most  of the driving, and also manages her schedule so hard to say because of that if she could manage these things by herself.  Does not appear to have any psychiatric history.  I would think this would be more of an Alzheimer's type dementia however given the changes in personality's swearing and agitation that is unusual for her, we need to rule out frontotemporal as well.  She has no signs or symptoms of Lewy body dementia at this time.  I do recommend a work-up.   MMSE 18, Dr. [name] started Donepezil, continue MRI of the brain to rule out reversible causes of dementia Formal memory testing with Dr. Piedad Climes [facility] FDG PET Scan to differentiate between Alzheimer's (most likely) and  frontotemporal Blood work today Will call and schedule follow up After we see when she meets with with Dr. Kieth Brightly so that complete workup   Orders Placed This Encounter  Procedures  . MR BRAIN W WO CONTRAST  . NM PET Metabolic Brain  . Vitamin B1  . Vitamin B12  . Methylmalonic acid, serum  . Homocysteine  . CBC  . Comprehensive metabolic panel  . TSH  . Ambulatory referral to Neuropsychology     Cc: Jarome Matin, MD,    Naomie Dean, MD  [facility] [address] [place], [place] [address]  Phone [phone] Fax [phone]  I spent more than 90 minutes of face-to-face and non-face-to-face time with patient on the  1. Cognitive and behavioral changes   2. Evaluation for Alzheimer or other dementia   3. Major neurocognitive disorder (HCC)   4. Confusion   5. Delusions (HCC)   6. Personality change   7. Agitation    diagnosis.  This included previsit chart review, lab review, study review, order entry, electronic health record documentation, patient education on the different diagnostic and therapeutic options, counseling and coordination of care, risks and benefits of management, compliance, or risk factor reduction

## 2019-10-15 ENCOUNTER — Telehealth: Payer: Self-pay | Admitting: Neurology

## 2019-10-15 ENCOUNTER — Encounter: Payer: Self-pay | Admitting: Neurology

## 2019-10-15 ENCOUNTER — Ambulatory Visit: Payer: Medicare Other | Admitting: Neurology

## 2019-10-15 ENCOUNTER — Encounter: Payer: Self-pay | Admitting: *Deleted

## 2019-10-15 ENCOUNTER — Encounter: Payer: Self-pay | Admitting: Psychology

## 2019-10-15 VITALS — BP 131/82 | HR 73 | Ht 64.0 in | Wt 125.0 lb

## 2019-10-15 DIAGNOSIS — R451 Restlessness and agitation: Secondary | ICD-10-CM

## 2019-10-15 DIAGNOSIS — G309 Alzheimer's disease, unspecified: Secondary | ICD-10-CM

## 2019-10-15 DIAGNOSIS — F0150 Vascular dementia without behavioral disturbance: Secondary | ICD-10-CM

## 2019-10-15 DIAGNOSIS — R4689 Other symptoms and signs involving appearance and behavior: Secondary | ICD-10-CM | POA: Diagnosis not present

## 2019-10-15 DIAGNOSIS — R410 Disorientation, unspecified: Secondary | ICD-10-CM

## 2019-10-15 DIAGNOSIS — F0390 Unspecified dementia without behavioral disturbance: Secondary | ICD-10-CM

## 2019-10-15 DIAGNOSIS — F688 Other specified disorders of adult personality and behavior: Secondary | ICD-10-CM

## 2019-10-15 DIAGNOSIS — R4189 Other symptoms and signs involving cognitive functions and awareness: Secondary | ICD-10-CM | POA: Diagnosis not present

## 2019-10-15 DIAGNOSIS — F22 Delusional disorders: Secondary | ICD-10-CM

## 2019-10-15 NOTE — Telephone Encounter (Signed)
[employer] medicare order sent to [facility],. No auth they will reach out to the patient to schedule.

## 2019-10-15 NOTE — Patient Instructions (Addendum)
Donepezil, continue MRI of the brain to rule out reversible or other causes of dementia - will call Formal memory testing with Dr. Marvetta Gibbons group - will call  FDG PET Scan to differentiate between causes Blood work today - will take to lab Will call and schedule follow up After we see when she meets with with Dr.Rodenbough so that complete workup   Dementia Dementia is a condition that affects the way the brain functions. It often affects memory and thinking. Usually, dementia gets worse with time and cannot be reversed (progressive dementia). There are many types of dementia, including:  Alzheimer's disease. This type is the most common.  Vascular dementia. This type may happen as the result of a stroke.  Lewy body dementia. This type may happen to people who have Parkinson's disease.  Frontotemporal dementia. This type is caused by damage to nerve cells (neurons) in certain parts of the brain. Some people may be affected by more than one type of dementia. This is called mixed dementia. What are the causes? Dementia is caused by damage to cells in the brain. The area of the brain and the types of cells damaged determine the type of dementia. Usually, this damage is irreversible or cannot be undone. Some examples of irreversible causes include:  Conditions that affect the blood vessels of the brain, such as diabetes, heart disease, or blood vessel disease.  Genetic mutations. In some cases, changes in the brain may be caused by another condition and can be reversed or slowed. Some examples of reversible causes include:  Injury to the brain.  Certain medicines.  Infection, such as meningitis.  Metabolic problems, such as vitamin B12 deficiency or thyroid disease.  Pressure on the brain, such as from a tumor or blood clot. What are the signs or symptoms? Symptoms of dementia depend on the type of dementia. Common signs of dementia include problems with remembering, thinking,  problem solving, decision making, and communicating. These signs develop slowly or get worse with time. This may include:  Problems remembering things.  Having trouble taking a bath or putting clothes on.  Forgetting appointments.  Forgetting to pay bills.  Difficulty planning and preparing meals.  Having trouble speaking.  Getting lost easily. How is this diagnosed? This condition is diagnosed by a specialist (neurologist). It is diagnosed based on the history of your symptoms, your medical history, a physical exam, and tests. Tests may include:  Tests to evaluate brain function, such as memory tests, cognitive tests, and other tests.  Lab tests, such as blood or urine tests.  Imaging tests, such as a CT scan, a PET scan, or an MRI.  Genetic testing. This may be done if other family members have a diagnosis of certain types of dementia. Your health care provider will talk with you and your family, friends, or caregivers about your history and symptoms. How is this treated?  Treatment for this condition depends on the cause of the dementia. Progressive dementias, such as Alzheimer's disease, cannot be cured, but there may be treatments that help to manage symptoms. Treatment might involve taking medicines that may help to:  Control the dementia.  Slow down the progression of the dementia.  Manage symptoms. In some cases, treating the cause of your dementia can improve symptoms, reverse symptoms, or slow down how quickly your dementia becomes worse. Your health care provider can direct you to support groups, organizations, and other health care providers who can help with decisions about your care. Follow these instructions  at home: Medicines  Take over-the-counter and prescription medicines only as told by your health care provider.  Use a pill organizer or pill reminder to help you manage your medicines.  Avoid taking medicines that can affect thinking, such as pain  medicines or sleeping medicines. Lifestyle  Make healthy lifestyle choices. ? Be physically active as told by your health care provider. ? Do not use any products that contain nicotine or tobacco, such as cigarettes, e-cigarettes, and chewing tobacco. If you need help quitting, ask your health care provider. ? Do not drink alcohol. ? Practice stress-management techniques when you get stressed. ? Spend time with other people.  Make sure to get quality sleep. These tips can help you get a good night's rest: ? Avoid napping during the day. ? Keep your sleeping area dark and cool. ? Avoid exercising during the few hours before you go to bed. ? Avoid caffeine products in the evening. Eating and drinking  Drink enough fluid to keep your urine pale yellow.  Eat a healthy diet. General instructions   Work with your health care provider to determine what you need help with and what your safety needs are.  Talk with your health care provider about whether it is safe for you to drive.  If you were given a bracelet that identifies you as a person with memory loss or tracks your location, make sure to wear it at all times.  Work with your family to make important decisions, such as advance directives, medical power of attorney, or a living will.  Keep all follow-up visits as told by your health care provider. This is important. Where to find more information  Alzheimer's Association: LimitLaws.hu  [employer] on Aging: CashCowGambling.be  World Health Organization: https://castaneda-walker.com/ Contact a health care provider if:  You have any new or worsening symptoms.  You have problems with choking or swallowing. Get help right away if:  You feel depressed or sad, or feel that you want to harm yourself.  Your family members become concerned for your safety. If you ever feel like you may hurt yourself or others, or have thoughts about taking your own life, get help right away. You  can go to your nearest emergency department or call:  Your local emergency services (911 in the [place]  A suicide crisis helpline, such as the National Suicide Prevention Lifeline at 302-369-0358. This is open 24 hours a day. Summary  Dementia is a condition that affects the way the brain functions. Dementia often affects memory and thinking.  Usually, dementia gets worse with time and cannot be reversed (progressive dementia).  Treatment for this condition depends on the cause of the dementia.  Work with your health care provider to determine what you need help with and what your safety needs are.  Your health care provider can direct you to support groups, organizations, and other health care providers who can help with decisions about your care. This information is not intended to replace advice given to you by your health care provider. Make sure you discuss any questions you have with your health care provider. Document Revised: 04/17/2018 Document Reviewed: 04/17/2018 Elsevier Patient Education  2020 [employer].

## 2019-10-17 ENCOUNTER — Telehealth: Payer: Self-pay | Admitting: Neurology

## 2019-10-17 NOTE — Telephone Encounter (Signed)
[employer] medicare pending faxed notes  

## 2019-10-18 LAB — COMPREHENSIVE METABOLIC PANEL
ALT: 21 IU/L (ref 0–32)
AST: 17 IU/L (ref 0–40)
Albumin/Globulin Ratio: 2 (ref 1.2–2.2)
Albumin: 4.7 g/dL (ref 3.7–4.7)
Alkaline Phosphatase: 109 IU/L (ref 48–121)
BUN/Creatinine Ratio: 16 (ref 12–28)
BUN: 14 mg/dL (ref 8–27)
Bilirubin Total: 0.4 mg/dL (ref 0.0–1.2)
CO2: 22 mmol/L (ref 20–29)
Calcium: 10.5 mg/dL — ABNORMAL HIGH (ref 8.7–10.3)
Chloride: 99 mmol/L (ref 96–106)
Creatinine, Ser: 0.85 mg/dL (ref 0.57–1.00)
GFR calc Af Amer: 78 mL/min/{1.73_m2} (ref >59)
GFR calc non Af Amer: 68 mL/min/{1.73_m2} (ref >59)
Globulin, Total: 2.4 g/dL (ref 1.5–4.5)
Glucose: 131 mg/dL — ABNORMAL HIGH (ref 65–99)
Potassium: 5.1 mmol/L (ref 3.5–5.2)
Sodium: 138 mmol/L (ref 134–144)
Total Protein: 7.1 g/dL (ref 6.0–8.5)

## 2019-10-18 LAB — CBC
Hematocrit: 46.2 % (ref 34.0–46.6)
Hemoglobin: 15.2 g/dL (ref 11.1–15.9)
MCH: 30.3 pg (ref 26.6–33.0)
MCHC: 32.9 g/dL (ref 31.5–35.7)
MCV: 92 fL (ref 79–97)
Platelets: 239 10*3/uL (ref 150–450)
RBC: 5.01 x10E6/uL (ref 3.77–5.28)
RDW: 13.2 % (ref 11.7–15.4)
WBC: 11.2 10*3/uL — ABNORMAL HIGH (ref 3.4–10.8)

## 2019-10-18 LAB — VITAMIN B1: Thiamine: 177.1 nmol/L (ref 66.5–200.0)

## 2019-10-18 LAB — TSH: TSH: 1.95 u[IU]/mL (ref 0.450–4.500)

## 2019-10-18 LAB — METHYLMALONIC ACID, SERUM: Methylmalonic Acid: 178 nmol/L (ref 0–378)

## 2019-10-18 LAB — HOMOCYSTEINE: Homocysteine: 9.3 umol/L (ref 0.0–19.2)

## 2019-10-18 LAB — VITAMIN B12: Vitamin B-12: 344 pg/mL (ref 232–1245)

## 2019-10-22 NOTE — Telephone Encounter (Signed)
Pet Scan auth. [employer] Medicare Berkley Harvey: [id number] (exp. 10/19/19 to 12/03/19)

## 2019-10-27 ENCOUNTER — Other Ambulatory Visit: Payer: Medicare Other

## 2019-10-28 NOTE — Telephone Encounter (Signed)
Patient scheduled.

## 2019-10-29 ENCOUNTER — Ambulatory Visit
Admission: RE | Admit: 2019-10-29 | Discharge: 2019-10-29 | Disposition: A | Discharge status: Home / Self Care | Payer: Medicare Other | Source: Ambulatory Visit | Attending: Neurology | Admitting: Neurology

## 2019-10-29 ENCOUNTER — Other Ambulatory Visit: Payer: Self-pay

## 2019-10-29 DIAGNOSIS — R410 Disorientation, unspecified: Secondary | ICD-10-CM

## 2019-10-29 DIAGNOSIS — G309 Alzheimer's disease, unspecified: Secondary | ICD-10-CM

## 2019-10-29 DIAGNOSIS — F22 Delusional disorders: Secondary | ICD-10-CM

## 2019-10-29 DIAGNOSIS — R4689 Other symptoms and signs involving appearance and behavior: Secondary | ICD-10-CM

## 2019-10-29 DIAGNOSIS — F688 Other specified disorders of adult personality and behavior: Secondary | ICD-10-CM

## 2019-10-29 DIAGNOSIS — F0390 Unspecified dementia without behavioral disturbance: Secondary | ICD-10-CM

## 2019-10-29 DIAGNOSIS — R451 Restlessness and agitation: Secondary | ICD-10-CM

## 2019-10-29 DIAGNOSIS — R4189 Other symptoms and signs involving cognitive functions and awareness: Secondary | ICD-10-CM

## 2019-10-29 MED ORDER — GADOBENATE DIMEGLUMINE 529 MG/ML IV SOLN
11.0000 mL | Freq: Once | INTRAVENOUS | Status: AC | PRN
  Administered 2019-10-29: 11 mL via INTRAVENOUS

## 2019-11-01 ENCOUNTER — Other Ambulatory Visit: Payer: Self-pay

## 2019-11-01 ENCOUNTER — Encounter (HOSPITAL_COMMUNITY)
Admission: RE | Admit: 2019-11-01 | Discharge: 2019-11-01 | Disposition: A | Discharge status: Home / Self Care | Payer: Medicare Other | Source: Ambulatory Visit | Attending: Neurology | Admitting: Neurology

## 2019-11-01 DIAGNOSIS — R410 Disorientation, unspecified: Secondary | ICD-10-CM | POA: Diagnosis not present

## 2019-11-01 DIAGNOSIS — F688 Other specified disorders of adult personality and behavior: Secondary | ICD-10-CM | POA: Diagnosis not present

## 2019-11-01 DIAGNOSIS — R4189 Other symptoms and signs involving cognitive functions and awareness: Secondary | ICD-10-CM | POA: Diagnosis not present

## 2019-11-01 DIAGNOSIS — F0150 Vascular dementia without behavioral disturbance: Secondary | ICD-10-CM | POA: Diagnosis not present

## 2019-11-01 DIAGNOSIS — G309 Alzheimer's disease, unspecified: Secondary | ICD-10-CM

## 2019-11-01 DIAGNOSIS — F0390 Unspecified dementia without behavioral disturbance: Secondary | ICD-10-CM

## 2019-11-01 DIAGNOSIS — R451 Restlessness and agitation: Secondary | ICD-10-CM | POA: Diagnosis not present

## 2019-11-01 DIAGNOSIS — F22 Delusional disorders: Secondary | ICD-10-CM | POA: Diagnosis not present

## 2019-11-01 DIAGNOSIS — R4689 Other symptoms and signs involving appearance and behavior: Secondary | ICD-10-CM

## 2019-11-01 LAB — GLUCOSE, CAPILLARY: Glucose-Capillary: 107 mg/dL — ABNORMAL HIGH (ref 70–99)

## 2019-11-01 MED ORDER — FLUDEOXYGLUCOSE F - 18 (FDG) INJECTION
9.9000 | Freq: Once | INTRAVENOUS | Status: AC
  Administered 2019-11-01: 9.9 via INTRAVENOUS

## 2019-11-04 ENCOUNTER — Telehealth: Payer: Self-pay | Admitting: *Deleted

## 2019-11-04 NOTE — Telephone Encounter (Signed)
The pt's husband Gene called me back. We discussed the PET metabolic brain scan results and the plan for formal memory testing. His questions were answered. He verbalized appreciation.

## 2019-11-04 NOTE — Telephone Encounter (Signed)
-----   Message from Anson Fret, MD sent at 11/02/2019  8:17 PM EDT ----- The PET Scan is suggestive of Alzheimer's disease. Please proceed to the formal memory testing( has an appointment 12/8) to verify and further evaluate. Dr. Lucia Gaskins

## 2019-11-04 NOTE — Telephone Encounter (Signed)
I called the pt's husband Gene (on DPR) and LVM (ok per DPR) asking for call back. Left office number and hours in message.

## 2019-11-05 NOTE — Telephone Encounter (Signed)
Husband Gene called stating that it has been 3 days that he has been waiting for the PET scan results to show on MyChart with the providers comments and he has not seen them. He states that no offense to the nurse but information like that he prefers to hear from an MD not a nurse. Gene also demanded that I tell the MD that she "Needs" to put this information on MyChart "pronto" like she is supposed to do. I stated that I will ask the provider when she will be able to put on my chart and he once again stated "there is no asking when she will post it, you need to "tell" her she needs to do it "pronto" I stated once again that I will send a  Message asking when it will be put on MyChart and he said "well if you are afraid to tell her then have her call me and I will tell her" I told the husband I will send a message and I ended the call. Please advise.

## 2019-11-05 NOTE — Telephone Encounter (Signed)
Dr Lucia Gaskins released the results of the pet scan to mychart.

## 2019-11-05 NOTE — Telephone Encounter (Signed)
Beth[place] discussed with patient and he was fine, I'm not sure but its likely he is reacting to the results. He was fine when [place] spoke with him so I will release results to [employer] why they were not released.

## 2019-11-06 ENCOUNTER — Other Ambulatory Visit: Payer: Medicare Other

## 2020-01-22 ENCOUNTER — Encounter: Payer: Medicare Other | Attending: Psychology | Admitting: Psychology

## 2020-01-22 ENCOUNTER — Other Ambulatory Visit: Payer: Self-pay

## 2020-01-22 DIAGNOSIS — R4189 Other symptoms and signs involving cognitive functions and awareness: Secondary | ICD-10-CM | POA: Diagnosis not present

## 2020-01-22 DIAGNOSIS — F09 Unspecified mental disorder due to known physiological condition: Secondary | ICD-10-CM

## 2020-01-22 DIAGNOSIS — R410 Disorientation, unspecified: Secondary | ICD-10-CM | POA: Diagnosis not present

## 2020-01-22 DIAGNOSIS — R4689 Other symptoms and signs involving appearance and behavior: Secondary | ICD-10-CM

## 2020-01-27 ENCOUNTER — Encounter: Payer: Medicare Other | Admitting: Psychology

## 2020-02-06 ENCOUNTER — Encounter: Payer: Self-pay | Admitting: Psychology
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
## 2020-03-12 ENCOUNTER — Ambulatory Visit: Payer: Medicare Other | Admitting: Psychology

## 2020-03-19 ENCOUNTER — Ambulatory Visit: Payer: Medicare Other | Admitting: Psychology

## 2020-03-30 ENCOUNTER — Encounter: Payer: Medicare Other | Attending: Psychology | Admitting: Psychology

## 2020-03-30 ENCOUNTER — Other Ambulatory Visit: Payer: Self-pay

## 2020-03-30 ENCOUNTER — Encounter: Payer: Self-pay | Admitting: Psychology

## 2020-03-30 DIAGNOSIS — R4689 Other symptoms and signs involving appearance and behavior: Secondary | ICD-10-CM

## 2020-03-30 DIAGNOSIS — R4189 Other symptoms and signs involving cognitive functions and awareness: Secondary | ICD-10-CM

## 2020-03-30 NOTE — Progress Notes (Addendum)
Neuropsychology Note  Heidi Chase completed 240 minutes of neuropsychological testing with this provider Medical laboratory scientific officer).   Mental Status: Appearance: Casually dressed and appropriately groomed.  Gait: Ambulated independently without assistance Speech: Clear with normal rate, tone, and volume. Moderate WFD Thought process:  Slightly disorganized, inattentive, tangential, and perseverative.  Paranoid. Self-deprecating and critical.  Mood/Affect:  Anxious and depressed, congruent with mood and appropriate.    Interpersonal: Pleasant, mildly disinhibited and inappropriate Orientation: O x 4   She was mostly cooperative with assigned tasks and appeared to give adequate effort. She did have some difficulty understanding test instructions and required a lot of additional prompting and repeated questioning; easily distracted and confused. She exhibited poor frustration tolerance on questions she did not know or tasks that were more difficult.  The patient did appear anxious and became moderately frustrated after immediate and delayed recall trials on the WMS-IV Older Adult Battery. Rest breaks were offered.     Tests Administered:  Clock Draw  Controlled Oral Word Association Test (COWAT; FAS & Animals)   Repeatable Battery for the Assessment of Neuropsychological Status, Updated (RBANS-Form A),  Select subtests   Wechsler Adult Intelligence Scale-Fourth Edition (WAIS-IV)  Wechsler Memory Scale-Fourth Edition (WMS-IV) Older Adult Battery   Results:   TEST SCORES:  Note: This summary of test scores accompanies the interpretive report and should not be considered in isolation without reference to the appropriate sections in the text. Descriptors are based on appropriate normative data and may be adjusted based on clinical judgment. The terms "impaired" and "within normal limits (WNL)" are used when a more specific level of functioning cannot be determined.    Attention/Executive  Function:          Scaled Score Percentile    RBANS Coding, Form A: 1 <1 Exceptionally Low         Language:               Verbal Fluency Test: Raw Score (Z Score) Percentile    Phonemic Fluency (FAS) 16 (-1.57) 6 Well Below Average    Animal Fluency 7 (-2.19) 2 Exceptionally Low           Raw Score  Percentile    RBANS Picture Naming, Form A: 5/10 <2 Average         Visuospatial/Visuoconstruction:          Raw Score Percentile    Clock Drawing: --- --- Impaired  RBANS Line Orientation, Form A: 7/20 <2 Exceptionally Low           WAIS-IV Composite Score Summary  Scale Sum of Scaled Scores Composite Score Percentile Rank 95% Conf. Interval Qualitative Description  Verbal Comprehension 16 VCI 74 4 69-81 Borderline  Perceptual Reasoning 18 PRI 77 6 72-84 Borderline  Working Memory 6 WMI 60 0.4 56-69 Extremely Low  Processing Speed 2 PSI 50 <0.1 47-63 Extremely Low  Full Scale 42 FSIQ 61 0.5 58-66 Extremely Low  General Ability 34 GAI 73 4 69-79 Borderline    Verbal Comprehension Subtests Summary  Subtest Raw Score Scaled Score Percentile Rank Reference Group Scaled Score SEM  Similarities 6 3 1 1  0.95  Vocabulary 31 9 37 9 0.67  Information 4 4 2 5  0.73    Perceptual Reasoning Subtests Summary  Subtest Raw Score Scaled Score Percentile Rank Reference Group Scaled Score SEM  Block Design 16 6 9 4  0.99  Matrix Reasoning 6 6 9 3  0.90  Visual Puzzles 6 6 9 4  0.99  Working Librarian, academic Raw Score Scaled Score Percentile Rank Reference Group Scaled Score SEM  Digit Span 11 2 0.4 1 0.73  Arithmetic 6 4 2 4  1.20   Working Memory Process Score Summary  Process Score Raw Score Scaled Score Percentile Rank Base Rate SEM  Digit Span Forward 6 5 5  -- 1.04  Digit Span Backward 4 5 5  -- 1.37  Digit Span Sequencing 1 2 0.4 -- 1.12  Longest Digit Span Forward 4 -- -- 99.0 --  Longest Digit Span Backward 2 -- -- 100.0 --  Longest Digit Span Sequence 2 --  -- 100.0 --    Processing Speed Subtests Summary  Subtest Raw Score Scaled Score Percentile Rank Reference Group Scaled Score SEM  Symbol Search 3 1 0.1 1 1.12  Coding 8 1 0.1 1 1.12    WMS-IV Brief Cognitive Status Exam Classification  Age Years of Education Raw Score Classification Level Base Rate  74 years 11 months 12 14 Very Low 0.0    Index Score Summary  Index Sum of Scaled Scores Index Score Percentile Rank 95% Confidence Interval Qualitative Descriptor  Auditory Memory (AMI) 8 49 <0.1 45-58 Extremely Low  Visual Memory (VMI) 2 40 <0.1 37-47 Extremely Low  Immediate Memory (IMI) 5 46 <0.1 42-55 Extremely Low  Delayed Memory (DMI) 5 46 <0.1 43-58 Extremely Low    Primary Subtest Scaled Score Summary  Subtest Domain Raw Score Scaled Score Percentile Rank  Logical Memory I AM 12 3 1   Logical Memory II AM 2 3 1   Verbal Paired Associates I AM 1 1 0.1  Verbal Paired Associates II AM 0 1 0.1  Visual Reproduction I VM 9 1 0.1  Visual Reproduction II VM 0 1 0.1  Symbol Span VWM 0 1 0.1   Auditory Memory Process Score Summary  Process Score Raw Score Scaled Score Percentile Rank Cumulative Percentage (Base Rate)  LM II Recognition 13 - - 3-9%  VPA II Recognition 15 - - <=2%    Visual Memory Process Score Summary  Process Score Raw Score Scaled Score Percentile Rank Cumulative Percentage (Base Rate)  VR II Recognition 2 - - 10-16%    Predicted Difference Method   Index Predicted WMS-IV Index Score Actual WMS-IV Index Score Difference Critical Value  Significant Difference Y/N Base Rate  Auditory Memory 86 49 37 10.41 Y <1%  Visual Memory 84 40 44 7.89 Y <1%  Immediate Memory 82 46 36 9.97 Y <1%  Delayed Memory 84 46 38 12.33 Y <1%  Statistical significance (critical value) at the .01 level.    Feedback to Patient: Heidi Chase will return on 05/27/20  for an interactive feedback session with Dr. at which time her test performances, clinical  impressions and treatment recommendations will be reviewed in detail. The patient understands she can contact our office should she require our assistance before this time.  Full report to follow.

## 2020-04-09 ENCOUNTER — Encounter (HOSPITAL_BASED_OUTPATIENT_CLINIC_OR_DEPARTMENT_OTHER): Payer: Medicare Other | Admitting: Psychology

## 2020-04-09 ENCOUNTER — Other Ambulatory Visit: Payer: Self-pay

## 2020-04-09 DIAGNOSIS — R4189 Other symptoms and signs involving cognitive functions and awareness: Secondary | ICD-10-CM | POA: Diagnosis not present

## 2020-04-09 DIAGNOSIS — F411 Generalized anxiety disorder: Secondary | ICD-10-CM

## 2020-04-09 DIAGNOSIS — F0281 Dementia in other diseases classified elsewhere with behavioral disturbance: Secondary | ICD-10-CM | POA: Diagnosis not present

## 2020-04-09 DIAGNOSIS — F09 Unspecified mental disorder due to known physiological condition: Secondary | ICD-10-CM | POA: Diagnosis not present

## 2020-04-09 DIAGNOSIS — F02818 Dementia in other diseases classified elsewhere, unspecified severity, with other behavioral disturbance: Secondary | ICD-10-CM

## 2020-04-09 DIAGNOSIS — G301 Alzheimer's disease with late onset: Secondary | ICD-10-CM | POA: Diagnosis not present

## 2020-04-13 ENCOUNTER — Encounter: Payer: Self-pay | Admitting: Psychology

## 2020-04-29 ENCOUNTER — Encounter: Payer: Self-pay | Admitting: Psychology
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
## 2020-05-27 ENCOUNTER — Encounter: Payer: Medicare Other | Attending: Psychology | Admitting: Psychology

## 2020-05-27 ENCOUNTER — Other Ambulatory Visit: Payer: Self-pay

## 2020-05-27 ENCOUNTER — Encounter: Payer: Self-pay | Admitting: Psychology

## 2020-05-27 DIAGNOSIS — F09 Unspecified mental disorder due to known physiological condition: Secondary | ICD-10-CM

## 2020-05-27 DIAGNOSIS — F0281 Dementia in other diseases classified elsewhere with behavioral disturbance: Secondary | ICD-10-CM | POA: Insufficient documentation

## 2020-05-27 DIAGNOSIS — R4689 Other symptoms and signs involving appearance and behavior: Secondary | ICD-10-CM | POA: Diagnosis present

## 2020-05-27 DIAGNOSIS — G301 Alzheimer's disease with late onset: Secondary | ICD-10-CM | POA: Diagnosis not present

## 2020-05-27 DIAGNOSIS — R4189 Other symptoms and signs involving cognitive functions and awareness: Secondary | ICD-10-CM | POA: Diagnosis not present

## 2020-05-27 DIAGNOSIS — F411 Generalized anxiety disorder: Secondary | ICD-10-CM

## 2020-05-27 DIAGNOSIS — F02818 Dementia in other diseases classified elsewhere, unspecified severity, with other behavioral disturbance: Secondary | ICD-10-CM

## 2020-05-27 NOTE — Progress Notes (Signed)
05/27/2020: 8 AM-9 AM  Today's visit was an in person visit is conducted in my outpatient clinic office with the patient, her husband and myself present.  We reviewed the results of the recent neuropsychological evaluation that was consistent with an initial diagnosis of late onset dementia of the Alzheimer's type with some behavioral disturbance but mostly agitation.  The patient continues to minimize some of her difficulties but acknowledged them with descriptions by her husband about what is going on.  Initially, going over the results and talking about his implications regarding late onset Alzheimer's type probability was challenging for the patient but we were able to review some of the implications and talk about the condition in greater detail.  The patient has responded well to Aricept being prescribed by her neurologist with improvement in symptoms.  We talked about ongoing efforts to manage this condition and things that she can do that will help overall quality of life including looking at foundational issues regarding maintaining active cognitive life, regular physical activity and good diet sleep patterns.  The patient was much more open about some of the difficulties as the feedback progressed.  We have set the patient up for follow-up testing if necessary in approximately 9 to 12 months in the follow-up appointment has been scheduled.  Below you will find a copy of the impression/summary of this evaluation for convenience and the patient's complete neuropsychological evaluation can be found in her EMR dated 04/09/2020.  Impression/Diagnosis:                     Overall, the results of the current neuropsychological evaluation are consistent with significant/severe cognitive deficits globally and widespread specific areas deficits including attention and concentration, reasoning and problem-solving abilities, severe deficits learning and retrieving new information, significant visual-spatial and  visual constructional deficits and significant executive functioning deficits.  The patient did display a good deal of anxiety and issues with frustration tolerance throughout the testing procedures but great efforts were made to encourage the patient and keep her on task to complete objective measures.  These results were not consistent with just anxiety as a primary component but that there are widespread cortical deficits identified and measures consistent with frontal, temporal and parietal dysfunction.  As far as diagnostic considerations, subjective symptoms and reports of progressive decline particularly over the past year, increased confabulation and attempts to explain significant deficits, increased paranoia and frustration with the patient, severe deficits in frontal, parietal and temporal lobe functioning as well as neuro imaging findings and other medical information all correlate very well with a primary diagnosis of late onset dementia of the Alzheimer's type.  While there were some mild scattered periventricular and subcortical foci of nonspecific gliosis there were no indications of acute findings or widespread cerebrovascular disease.  PET scan suggest reduced metabolic function and parietal lobes although there was no significant evidence of significant metabolic changes in frontal or temporal lobes.  There are indications of severe deficits with regard to frontal, temporal and occipital lobe functioning on neurocognitive measures.  As far as treatment recommendations, the patient very likely has a cortically mediated progressive dementia most consistent with late onset Alzheimer's and particular planning and adjustment for the likely progressive nature will be important for the family.  Dealing with increased agitation, confabulation, and potential increases in anxiety and paranoia due to confabulatory explanations of the resulting confusion and memory deficits will need to be addressed.   The patient's pre-existing issues with anxiety are likely  also going to become more problematic with time.  I will sit down with the family and the patient on April 13 for a in person feedback regarding the results as well and we will review various treatment options etc.  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  We will also likely need to address issues related to potential worsening of her anxiety psychotropic Lee.  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 Clinical Neuropsychologist

## 2020-06-22 ENCOUNTER — Other Ambulatory Visit: Payer: Self-pay | Admitting: Neurology

## 2020-06-22 DIAGNOSIS — I639 Cerebral infarction, unspecified: Secondary | ICD-10-CM

## 2020-06-23 ENCOUNTER — Telehealth: Payer: Self-pay | Admitting: Neurology

## 2020-06-23 NOTE — Telephone Encounter (Signed)
Patient and wife only want to proceed with MRI of the brain right now so please cancel the MRA of the head thanks

## 2020-06-23 NOTE — Telephone Encounter (Signed)
[employer] medicare order sent to [facility]. No auth they will reach out to the patient to schedule.  

## 2020-06-24 NOTE — Telephone Encounter (Signed)
Noted, thank you

## 2020-07-19 ENCOUNTER — Ambulatory Visit
Admission: RE | Admit: 2020-07-19 | Discharge: 2020-07-19 | Disposition: A | Discharge status: Home / Self Care | Payer: Medicare Other | Source: Ambulatory Visit | Attending: Neurology | Admitting: Neurology

## 2020-07-19 DIAGNOSIS — I639 Cerebral infarction, unspecified: Secondary | ICD-10-CM

## 2020-09-07 ENCOUNTER — Telehealth (INDEPENDENT_AMBULATORY_CARE_PROVIDER_SITE_OTHER): Payer: Medicare Other | Admitting: Neurology

## 2020-09-07 ENCOUNTER — Telehealth: Payer: Medicare Other | Admitting: Neurology

## 2020-09-07 ENCOUNTER — Encounter: Payer: Self-pay | Admitting: Neurology

## 2020-09-07 DIAGNOSIS — F0281 Dementia in other diseases classified elsewhere with behavioral disturbance: Secondary | ICD-10-CM

## 2020-09-07 DIAGNOSIS — F02818 Dementia in other diseases classified elsewhere, unspecified severity, with other behavioral disturbance: Secondary | ICD-10-CM | POA: Insufficient documentation

## 2020-09-07 DIAGNOSIS — G301 Alzheimer's disease with late onset: Secondary | ICD-10-CM

## 2020-09-07 MED ORDER — DONEPEZIL HCL 10 MG PO TABS: 10.0000 mg | ORAL_TABLET | Freq: Every day | ORAL | 4 refills | Status: DC

## 2020-09-07 NOTE — Patient Instructions (Signed)
Increase Donepezil Mychart me when you want to start memantine  Memantine Tablets What is this medication? MEMANTINE (MEM an teen) is used to treat dementia caused by Alzheimer's disease. This medicine may be used for other purposes; ask your health care provider orpharmacist if you have questions. COMMON BRAND NAME(S): Namenda What should I tell my care team before I take this medication? They need to know if you have any of these conditions: difficulty passing urine kidney disease liver disease seizures an unusual or allergic reaction to memantine, other medicines, foods, dyes, or preservatives pregnant or trying to get pregnant breast-feeding How should I use this medication? Take this medicine by mouth with a glass of water. Follow the directions on the prescription label. You may take this medicine with or without food. Take your doses at regular intervals. Do not take your medicine more often than directed. Continue to take your medicine even if you feel better. Do not stop takingexcept on the advice of your doctor or health care professional. Talk to your pediatrician regarding the use of this medicine in children.Special care may be needed. Overdosage: If you think you have taken too much of this medicine contact apoison control center or emergency room at once. NOTE: This medicine is only for you. Do not share this medicine with others. What if I miss a dose? If you miss a dose, take it as soon as you can. If it is almost time for your next dose, take only that dose. Do not take double or extra doses. If you do not take your medicine for several days, contact your health care provider.Your dose may need to be changed. What may interact with this medication? acetazolamide amantadine cimetidine dextromethorphan dofetilide hydrochlorothiazide ketamine metformin methazolamide quinidine ranitidine sodium bicarbonate triamterene This list may not describe all possible  interactions. Give your health care provider a list of all the medicines, herbs, non-prescription drugs, or dietary supplements you use. Also tell them if you smoke, drink alcohol, or use illegaldrugs. Some items may interact with your medicine. What should I watch for while using this medication? Visit your doctor or health care professional for regular checks on your progress. Check with your doctor or health care professional if there is noimprovement in your symptoms or if they get worse. You may get drowsy or dizzy. Do not drive, use machinery, or do anything that needs mental alertness until you know how this drug affects you. Do not stand or sit up quickly, especially if you are an older patient. This reduces the risk of dizzy or fainting spells. Alcohol can make you more drowsy and dizzy.Avoid alcoholic drinks. What side effects may I notice from receiving this medication? Side effects that you should report to your doctor or health care professionalas soon as possible: allergic reactions like skin rash, itching or hives, swelling of the face, lips, or tongue agitation or a feeling of restlessness depressed mood dizziness hallucinations redness, blistering, peeling or loosening of the skin, including inside the mouth seizures vomiting Side effects that usually do not require medical attention (report to yourdoctor or health care professional if they continue or are bothersome): constipation diarrhea headache nausea trouble sleeping This list may not describe all possible side effects. Call your doctor for medical advice about side effects. You may report side effects to FDA at1-800-FDA-1088. Where should I keep my medication? Keep out of the reach of children. Store at room temperature between 15 degrees and 30 degrees C (59 degrees and86 degrees F). Throw  away any unused medicine after the expiration date. NOTE: This sheet is a summary. It may not cover all possible information. If  you have questions about this medicine, talk to your doctor, pharmacist, orhealth care provider.  2022 [employer] Standard (2012-11-19 14:10:42) Donepezil tablets What is this medication? DONEPEZIL (doe NEP e zil) is used to treat mild to moderate dementia caused byAlzheimer's disease. This medicine may be used for other purposes; ask your health care provider orpharmacist if you have questions. COMMON BRAND NAME(S): Aricept What should I tell my care team before I take this medication? They need to know if you have any of these conditions: asthma or other lung disease difficulty passing urine head injury heart disease history of irregular heartbeat liver disease seizures (convulsions) stomach or intestinal disease, ulcers or stomach bleeding an unusual or allergic reaction to donepezil, other medicines, foods, dyes, or preservatives pregnant or trying to get pregnant breast-feeding How should I use this medication? Take this medicine by mouth with a glass of water. Follow the directions on the prescription label. You may take this medicine with or without food. Take this medicine at regular intervals. This medicine is usually taken before bedtime. Do not take it more often than directed. Continue to take your medicine even ifyou feel better. Do not stop taking except on your doctor's advice. If you are taking the 23 mg donepezil tablet, swallow it whole; do not cut,crush, or chew it. Talk to your pediatrician regarding the use of this medicine in children.Special care may be needed. Overdosage: If you think you have taken too much of this medicine contact apoison control center or emergency room at once. NOTE: This medicine is only for you. Do not share this medicine with others. What if I miss a dose? If you miss a dose, take it as soon as you can. If it is almost time for yournext dose, take only that dose, do not take double or extra doses. What may interact with this  medication? Do not take this medicine with any of the following medications: certain medicines for fungal infections like itraconazole, fluconazole, posaconazole, and voriconazole cisapride dextromethorphan; quinidine dronedarone pimozide quinidine thioridazine This medicine may also interact with the following medications: antihistamines for allergy, cough and cold atropine bethanechol carbamazepine certain medicines for bladder problems like oxybutynin, tolterodine certain medicines for Parkinson's disease like benztropine, trihexyphenidyl certain medicines for stomach problems like dicyclomine, hyoscyamine certain medicines for travel sickness like scopolamine dexamethasone dofetilide ipratropium NSAIDs, medicines for pain and inflammation, like ibuprofen or naproxen other medicines for Alzheimer's disease other medicines that prolong the QT interval (cause an abnormal heart rhythm) phenobarbital phenytoin rifampin, rifabutin or rifapentine ziprasidone This list may not describe all possible interactions. Give your health care provider a list of all the medicines, herbs, non-prescription drugs, or dietary supplements you use. Also tell them if you smoke, drink alcohol, or use illegaldrugs. Some items may interact with your medicine. What should I watch for while using this medication? Visit your doctor or health care professional for regular checks on your progress. Check with your doctor or health care professional if your symptomsdo not get better or if they get worse. You may get drowsy or dizzy. Do not drive, use machinery, or do anything thatneeds mental alertness until you know how this drug affects you. What side effects may I notice from receiving this medication? Side effects that you should report to your doctor or health care professionalas soon as possible: allergic reactions like skin rash, itching  or hives, swelling of the face, lips, or tongue feeling faint or  lightheaded, falls loss of bladder control seizures signs and symptoms of a dangerous change in heartbeat or heart rhythm like chest pain; dizziness; fast or irregular heartbeat; palpitations; feeling faint or lightheaded, falls; breathing problems signs and symptoms of infection like fever or chills; cough; sore throat; pain or trouble passing urine signs and symptoms of liver injury like dark yellow or brown urine; general ill feeling or flu-like symptoms; light-colored stools; loss of appetite; nausea; right upper belly pain; unusually weak or tired; yellowing of the eyes or skin slow heartbeat or palpitations unusual bleeding or bruising vomiting Side effects that usually do not require medical attention (report to yourdoctor or health care professional if they continue or are bothersome): diarrhea, especially when starting treatment headache loss of appetite muscle cramps nausea stomach upset This list may not describe all possible side effects. Call your doctor for medical advice about side effects. You may report side effects to FDA at1-800-FDA-1088. Where should I keep my medication? Keep out of reach of children. Store at room temperature between 15 and 30 degrees C (59 and 86 degrees F).Throw away any unused medicine after the expiration date. NOTE: This sheet is a summary. It may not cover all possible information. If you have questions about this medicine, talk to your doctor, pharmacist, orhealth care provider.  2022 [employer] Standard (2018-01-22 10:33:41)

## 2020-09-07 NOTE — Progress Notes (Signed)
[facility]    Provider:  Dr Lucia Gaskins Requesting Provider: Jarome Matin, MD Primary Care Provider:  Jarome Matin, MD  CC:  Late onset alzheimer's disease  Virtual Visit via Video Note  I connected with Heidi Chase on 09/07/20 at 10:00 AM EDT by a video enabled telemedicine application and verified that I am speaking with the correct person using two identifiers.  Location: Patient: home Provider: office   I discussed the limitations of evaluation and management by telemedicine and the availability of in person appointments. The patient expressed understanding and agreed to proceed.   09/07/2020: She is still cooks, not driving, they have Covid. We discussed below, a little tickle in the throat, no congestion or fever. They feel she is stable, not much different. No new behavioral issues. We will meet again in 6 months vis mychart and he will let me know when to prescribe memantine.  Rodenbough 05/27/2020: Formal neurocognitive testing consistent with late onset dementia c/w significant/severe cognitive deficits globally (see notes in chart) of Alzheimer's type with mild behavioral agitation. Reviewed Dr. Marvetta Gibbons notes, he recommended Aricept , active lifestyle, good diet and sleep, managing anxiety and behavior. Follow up testing in 9-12 weeks.  11/01/2019: FDG PET SCAN: Decreased relative cortical metabolism within the high parietal lobes is a finding suggestive Alzheimer's type pathology. Relatively subtle finding.   No evidence of frontotemporal dementia 07/2020: MRI brain : IMPRESSION: This MRI of the brain without contrast shows the following: 1.   Scattered T2/FLAIR hyperintense foci in the hemispheres consistent with mild chronic microvascular ischemic change.  This is unchanged compared to the 10/29/2019 MRI. 2.   No mid or large vessel strokes. 3.   No acute findings.  07/2020: MRA head : IMPRESSION: This MR angiogram of the intracranial arteries  shows the following: 1.   Mild stenosis within the clinoid segment of the left internal carotid artery. 2.   Moderate stenosis in the P2 segment of the left posterior cerebral artery.   Personally reviewed images and agree with above   HPI:  Heidi Chase is a 75 y.o. female here as requested by Jarome Matin, MD for forgetfulness. PMHx HLD, SVT, elevated LFTs/fatty liver, SM, afib, anxiety, CAD, .  I reviewed Dr. Norval Gable notes: In the office in May of this year MMSE was 16 out of 30 with points lost in orientation, counting backwards, was only able to name 2 out of the 3 items she was told, she was never a smoker, she reports that she is becoming more forgetful and she would lose things, also at night she gets agitated, she frequently misplaces things, never gets lost, but she does forget names and repeats questions.  Dr. Jarold Motto noted moderate impairment in short-term memory, slowly progressive and is most likely from Alzheimer's disease, in the past her vitamin B12 has been normal with supplementation, they did check a B12 level, TSH and RPR to rule out secondary causes of dementia and she was started on Aricept, her poor quality of sleep is chronic and may have obstructive sleep apnea contributing to forgetfulness and difficult to control hypertension.  She also reported snoring and mild daytime fatigue but declined sleep test, she also has chronic insomnia yet still declined to have screening, no stated personal or family history of Alzheimer's in the notes from Dr. Jarold Motto, examination showed normal cardiovascular, musculoskeletal, neurologic exams but showed reduced short-term memory and mildly anxious mood and affect  Patient is here with her husband who also provides much information.  She has had slowly progressive memory loss and forgetfulness, started worsening about 6 to 8 months ago, she is worried about it and it makes her feel intimidated like she wants to the right thing, she stopped  atorvastatin per cardiology for few weeks but that did not help. They state no concerns prior to 6-8 months ago, husband says she started misplacing pocket book, phone, husband had to put trackers on each one, not in unusual places but under the bed or behind a couch pillow. Wife says she thinks all is fine, its mostly her husband complaints. She is cooking well, not forgetting ingredients and sometimes uses the recipe, the only problem is she is misplacing the same things. No accidents in the car, although husband has always driven more, but if she does drive she doesn't get lost, husband has always managed the finances because he is a computer guy, she has her own checking account a debit card and no confusion or spending money inappropriately. She may ask the same question 2-3 times in the same day/conversatio but not frequently, no hallucinations but every night she hides her pocket book because she is being careful but husband says she may think someone is trying to take it (this just started as she started losing things). Sometimes she acuses her husband maybe of having a girlfriend, she denies it, she says it when she can;t find something and says maybe husband's girlfriend took it (husband seems to think she is being serious and has it in her mind). If he tries to challenge her, she gets very agitated and he feels accused of something. Sleeping well. No waking up or vivid dreams. Husband takes care of everything, prints out appointments, details it for her, manages the finances so hard to say how impaired she is with more short-term memory loss. Mother had dementia, unknown type, mother was 39s. No hx of alcohol or drug use or smoking. Denies depression or anxiety or a hx of psychiatric disorders.  Reviewed notes, labs and imaging from outside physicians, which showed;  Labs collected Jun 22, 2018 include CMP with BUN 15 and creatinine 0.8 otherwise unremarkable, TSH 1.42 normal.  hgba1c 6.76  03/2019  CT head 2015: reviewed images, HEAD CT WITH AND WITHOUT CONTRAST  Spiral scanning is performed before and after intravenous contrast administration. The study is done in CT angiographic fashion.    The brain does not show any evidence of stroke, mass, hemorrhage, hydrocephalus, or extraaxial collection. All major circle of Willis branches are patent. Both vertebral arteries are patent to form the basilar artery.    IMPRESSION  Normal head CT.    Review of Systems: Patient complains of symptoms per HPI as well as the following symptoms: covid . Pertinent negatives and positives per HPI. All others negative    Social History   Socioeconomic History   Marital status: Married    Spouse name: Not on file   Number of children: Not on file   Years of education: Not on file   Highest education level: High school graduate  Occupational History   Not on file  Tobacco Use   Smoking status: Never   Smokeless tobacco: Never  Vaping Use   Vaping Use: Never used  Substance and Sexual Activity   Alcohol use: No    Alcohol/week: 0.0 standard drinks   Drug use: No   Sexual activity: Yes    Birth control/protection: Post-menopausal, Surgical    Comment: intercourse age 18, sexual partners less than  5  Other Topics Concern   Not on file  Social History Narrative   ** Merged History Encounter **       Lives at home with husband Right handed Caffeine: "some", maybe two in a week   Social Determinants of Corporate investment banker Strain: Not on file  Food Insecurity: Not on file  Transportation Needs: Not on file  Physical Activity: Not on file  Stress: Not on file  Social Connections: Not on file  Intimate Partner Violence: Not on file    Family History  Problem Relation Age of Onset   Heart disease Father    Colon cancer Father 64   Hypertension Father    Heart attack Mother    Diabetes Mother    Hypertension Mother    Colon cancer Mother 108   Heart disease  Mother    Dementia Mother    Colon cancer Brother 60   Heart attack Maternal Grandfather    Heart attack Maternal Grandmother    Heart attack Paternal Grandfather    Heart disease Sister    Diabetes Sister    Hypertension Sister    Cancer Sister        UTERINE   Uterine cancer Sister    Cancer Sister        UTERINE    Past Medical History:  Diagnosis Date   Anxiety    Atrial fibrillation (HCC)    Atypical chest pain    with Lexiscan test normal, with LV Efx 74%   CAD (coronary artery disease)    CT coronary calcium study showed 3 vessel CAD (86 percentile), had 10/2018 stress echo showing normal LV Efx and poor exercise intolerance, started on Atorvastatin by cards   Chronic insomnia    with snoring, sleep study recommended, in 2019 declined to have screening nocturnal pulse oximetry study   Diabetes mellitus    Diverticulosis    Elevated LFTs    Fatty liver    Gallstones 02/2017   without obstruction or infection. nuclear medicine hepatobiliary study in December 2019 showed normal gallbladder function   GERD (gastroesophageal reflux disease)    Hyperlipidemia    IBS (irritable bowel syndrome)    Positive H. 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) 09/07/2020   Family history of colon cancer 05/20/2016   LGSIL Pap smear of vagina 08/12/2015   Osteopenia 07/31/2012   Vaginal atrophy 07/31/2012   CHEST PAIN, NON-CARDIAC 06/20/2008   ATRIAL FIBRILLATION, HX OF 06/20/2008    Past Surgical History:  Procedure Laterality Date   BREAST BIOPSY  03/1999   left   CARDIAC CATHETERIZATION  2008   without significant CAD   COLONOSCOPY  2017   showed sigmoid diverticulosis with no polyps (f/u in 2022)   COLPOSCOPY  2007   DILATION AND  CURETTAGE, DIAGNOSTIC / THERAPEUTIC     ELECTROPHYSIOLOGIC STUDY  2005   Multiple atrial arrhythmias, 2 flutters with different atrial activation sequence, P waves, irregular rhythm, atrial fibrillation   ESOPHAGOGASTRODUODENOSCOPY  11/2012   showed mild gastritis    LAPAROSCOPY  1974   LIVER BIOPSY  03/1999   PELVIC LAPAROSCOPY     pr denies breast biopsy     REPAIR RECTOCELE  ROTATOR CUFF REPAIR  10/2000   left   thumb surgery  10/2000   TONSILLECTOMY     VAGINAL HYSTERECTOMY  03/23/04    Current Outpatient Medications  Medication Sig Dispense Refill   donepezil (ARICEPT) 10 MG tablet Take 1 tablet (10 mg total) by mouth at bedtime. 90 tablet 4   atorvastatin (LIPITOR) 40 MG tablet Take by mouth. Take 40 mg one day and 20 mg the next day (alternating)     metFORMIN (GLUCOPHAGE-XR) 500 MG 24 hr tablet Take 1,000 mg by mouth 2 (two) times daily.      metoprolol (TOPROL-XL) 50 MG 24 hr tablet Take 50 mg by mouth daily.      No current facility-administered medications for this visit.    Allergies as of 09/07/2020   (No Known Allergies)    Vitals: There were no vitals taken for this visit. Last Weight:  Wt Readings from Last 1 Encounters:  10/15/19 125 lb (56.7 kg)   Last Height:   Ht Readings from Last 1 Encounters:  10/15/19  (1.626 m)     Physical exam: Exam: Gen: NAD CV: attempted, Could not perform over Web Video. Denies palpitations or chest pain or SOB. VS: Breathing at a normal rate. afebrile Eyes: Conjunctivae clear without exudates or hemorrhage  Neuro: Detailed Neurologic Exam  Speech:    Speech is normal; fluent and spontaneous with normal comprehension.  Cognition: MMSE - Mini Mental State Exam 10/15/2019  Orientation to time 4  Orientation to Place 3  Registration 3  Attention/ Calculation 0  Recall 0  Language- name 2 objects 2  Language- repeat 1  Language- follow 3 step command 3  Language- read & follow direction 1  Write a sentence  1  Copy design 0  Total score 18    Cranial Nerves:   Extraocular movements are intact.  The face is symmetric with normal sensation. The palate elevates in the midline. Hearing intact. Voice is normal.     Motor Observation:   no involuntary movements noted. Tone:    Appears normal  Posture:    Posture is normal. normal erect    Strength:    Strength is anti-gravity and symmetric in the upper and lower limbs.         Assessment/Plan:     Follow Up Instructions:    I discussed the assessment and treatment plan with the patient. The patient was provided an opportunity to ask questions and all were answered. The patient agreed with the plan and demonstrated an understanding of the instructions.   The patient was advised to call back or seek an in-person evaluation if the symptoms worsen or if the condition fails to improve as anticipated.  Over 30 minutes was spent Video face-to-face(not in person) with this patient. Over half this time was spent on counseling patient on the No diagnosis found. diagnosis and different diagnostic and therapeutic options, counseling and coordination of care, risks ans benefits of management, compliance, or risk factor reduction and education.     Assessment/Plan: 75 year old with Alzheimer's diseae. Mother had dementia, unknown type, mother was 90s. No hx of alcohol or drug use or smoking. Denies depression or anxiety or a hx of psychiatric disorders.  he is still cooks, not driving, they have Covid. We discussed below, a little tickle in the throat, no congestion or fever. They feel she is stable, not much different. No new behavioral issues. We will meet again in 6 months via mychart  and he will let me know when to prescribe memantine.  Rodenbough 05/27/2020: Formal neurocognitive testing consistent with late onset dementia c/w significant/severe cognitive deficits globally (see notes in chart) of Alzheimer's type with mild behavioral agitation.  Reviewed Dr. Marvetta Gibbonsodenbough's notes, he recommended Aricept , active lifestyle, good diet and sleep, managing anxiety and behavior. Follow up testing not required, patient and husband would not like to have it and that is fine.   11/01/2019: FDG PET SCAN: Decreased relative cortical metabolism within the high parietal lobes is a finding suggestive Alzheimer's type pathology. Relatively subtle finding.   No evidence of frontotemporal dementia 07/2020: MRI brain : IMPRESSION: This MRI of the brain without contrast shows the following: 1.   Scattered T2/FLAIR hyperintense foci in the hemispheres consistent with mild chronic microvascular ischemic change.  This is unchanged compared to the 10/29/2019 MRI. 2.   No mid or large vessel strokes. 3.   No acute findings.  07/2020: MRA head : IMPRESSION: This MR angiogram of the intracranial arteries shows the following: 1.   Mild stenosis within the clinoid segment of the left internal carotid artery. 2.   Moderate stenosis in the P2 segment of the left posterior cerebral artery.  Meds ordered this encounter  Medications   donepezil (ARICEPT) 10 MG tablet    Sig: Take 1 tablet (10 mg total) by mouth at bedtime.    Dispense:  90 tablet    Refill:  4      Cc: Jarome MatinPaterson, Daniel, MD,    Naomie DeanAntonia Dhyan Noah, MD  [facility] [address] [place], [place] [id number]  Phone [phone] Fax [phone]

## 2021-03-01 ENCOUNTER — Ambulatory Visit: Payer: Medicare Other | Admitting: Psychology

## 2021-04-27 ENCOUNTER — Encounter: Payer: Self-pay | Admitting: Neurology

## 2021-04-28 ENCOUNTER — Telehealth: Payer: Self-pay | Admitting: Neurology

## 2021-04-28 NOTE — Telephone Encounter (Signed)
Kennyth Lose, RN informed pt has been scheduled with Amy, NP for 5-2 for f/u ?

## 2021-05-26 ENCOUNTER — Ambulatory Visit: Payer: Medicare Other | Admitting: Family Medicine

## 2021-05-26 ENCOUNTER — Encounter: Payer: Self-pay | Admitting: Family Medicine

## 2021-05-26 VITALS — BP 159/77 | HR 60 | Ht 64.0 in | Wt 129.0 lb

## 2021-05-26 DIAGNOSIS — F22 Delusional disorders: Secondary | ICD-10-CM

## 2021-05-26 DIAGNOSIS — G301 Alzheimer's disease with late onset: Secondary | ICD-10-CM | POA: Diagnosis not present

## 2021-05-26 DIAGNOSIS — R4689 Other symptoms and signs involving appearance and behavior: Secondary | ICD-10-CM

## 2021-05-26 DIAGNOSIS — R4189 Other symptoms and signs involving cognitive functions and awareness: Secondary | ICD-10-CM

## 2021-05-26 DIAGNOSIS — F02818 Dementia in other diseases classified elsewhere, unspecified severity, with other behavioral disturbance: Secondary | ICD-10-CM | POA: Diagnosis not present

## 2021-05-26 MED ORDER — MEMANTINE HCL 5 MG PO TABS: 5.0000 mg | ORAL_TABLET | Freq: Two times a day (BID) | ORAL | 3 refills | Status: DC

## 2021-05-26 NOTE — Progress Notes (Signed)
? ? ?Chief Complaint  ?Patient presents with  ? Follow-up  ?  Rm 12 with husband Gene here for f/u on memory loss. Husband reports she has been doing ok, struggles with paranoia and hiding items in the house.   ? ? ?HISTORY OF PRESENT ILLNESS: ? ?05/26/21 ALL:  ?Heidi Chase is a 76 y.o. female here today for follow up for dementia. She presents with her husband, Gene, who aids in history. She was last seen via Mychart visit 08/2020. She has continues donepezil 10mg  daily and seems to tolerate it well. Memory seems to wax and wane. She has more difficulty with hiding things around the home (ie phone, glasses). Husband has tracker on her phone. Delusions seem to be resolved. She continues to have some concerns of paranoia. She seems to do better when around people she is comfortable with. She has a good appetite. She is sleeping well. No falls or trouble walking.  ? ?HISTORY (copied from Dr Trevor MaceAhern's previous note) ? ?09/07/2020: She is still cooks, not driving, they have Covid. We discussed below, a little tickle in the throat, no congestion or fever. They feel she is stable, not much different. No new behavioral issues. We will meet again in 6 months vis mychart and he will let me know when to prescribe memantine. ?  ?Rodenbough 05/27/2020: Formal neurocognitive testing consistent with late onset dementia c/w significant/severe cognitive deficits globally (see notes in chart) of Alzheimer's type with mild behavioral agitation. Reviewed Dr. Marvetta Gibbonsodenbough's notes, he recommended Aricept , active lifestyle, good diet and sleep, managing anxiety and behavior. Follow up testing in 9-12 weeks. ?  ?11/01/2019: FDG PET SCAN: Decreased relative cortical metabolism within the high parietal ?lobes is a finding suggestive Alzheimer's type pathology. Relatively ?subtle finding. ?  ?No evidence of frontotemporal dementia ?07/2020: MRI brain : IMPRESSION: This MRI of the brain without contrast shows the following: ?1.   Scattered T2/FLAIR  hyperintense foci in the hemispheres consistent with mild chronic microvascular ischemic change.  This is unchanged compared to the 10/29/2019 MRI. ?2.   No mid or large vessel strokes. ?3.   No acute findings. ?  ?07/2020: MRA head : IMPRESSION: This MR angiogram of the intracranial arteries shows the following: ?1.   Mild stenosis within the clinoid segment of the left internal carotid artery. ?2.   Moderate stenosis in the P2 segment of the left posterior cerebral artery. ?  ?  ?Personally reviewed images and agree with above  ?  ?HPI:  Heidi NgJane Smolinski is a 76 y.o. female here as requested by Jarome MatinPaterson, Daniel, MD for forgetfulness. PMHx HLD, SVT, elevated LFTs/fatty liver, SM, afib, anxiety, CAD, .  I reviewed Dr. Norval GablePatterson's notes: In the office in May of this year MMSE was 16 out of 30 with points lost in orientation, counting backwards, was only able to name 2 out of the 3 items she was told, she was never a smoker, she reports that she is becoming more forgetful and she would lose things, also at night she gets agitated, she frequently misplaces things, never gets lost, but she does forget names and repeats questions.  Dr. Jarold MottoPatterson noted moderate impairment in short-term memory, slowly progressive and is most likely from Alzheimer's disease, in the past her vitamin B12 has been normal with supplementation, they did check a B12 level, TSH and RPR to rule out secondary causes of dementia and she was started on Aricept, her poor quality of sleep is chronic and may have obstructive sleep apnea  contributing to forgetfulness and difficult to control hypertension.  She also reported snoring and mild daytime fatigue but declined sleep test, she also has chronic insomnia yet still declined to have screening, no stated personal or family history of Alzheimer's in the notes from Dr. Jarold Motto, examination showed normal cardiovascular, musculoskeletal, neurologic exams but showed reduced short-term memory and mildly anxious  mood and affect ?  ?Patient is here with her husband who also provides much information.  She has had slowly progressive memory loss and forgetfulness, started worsening about 6 to 8 months ago, she is worried about it and it makes her feel intimidated like she wants to the right thing, she stopped atorvastatin per cardiology for few weeks but that did not help. They state no concerns prior to 6-8 months ago, husband says she started misplacing pocket book, phone, husband had to put trackers on each one, not in unusual places but under the bed or behind a couch pillow. Wife says she thinks all is fine, its mostly her husband complaints. She is cooking well, not forgetting ingredients and sometimes uses the recipe, the only problem is she is misplacing the same things. No accidents in the car, although husband has always driven more, but if she does drive she doesn't get lost, husband has always managed the finances because he is a computer guy, she has her own checking account a debit card and no confusion or spending money inappropriately. She may ask the same question 2-3 times in the same day/conversatio but not frequently, no hallucinations but every night she hides her pocket book because she is being careful but husband says she may think someone is trying to take it (this just started as she started losing things). Sometimes she acuses her husband maybe of having a girlfriend, she denies it, she says it when she can;t find something and says maybe husband's girlfriend took it (husband seems to think she is being serious and has it in her mind). If he tries to challenge her, she gets very agitated and he feels accused of something. Sleeping well. No waking up or vivid dreams. Husband takes care of everything, prints out appointments, details it for her, manages the finances so hard to say how impaired she is with more short-term memory loss. Mother had dementia, unknown type, mother was 63s. No hx of alcohol  or drug use or smoking. Denies depression or anxiety or a hx of psychiatric disorders. ?  ?Reviewed notes, labs and imaging from outside physicians, which showed; ?  ?Labs collected Jun 22, 2018 include CMP with BUN 15 and creatinine 0.8 otherwise unremarkable, TSH 1.42 normal. ?  ?hgba1c 6.76 03/2019 ?  ?CT head 2015: reviewed images, HEAD CT WITH AND WITHOUT CONTRAST  ?Spiral scanning is performed before and after intravenous contrast administration. The study is done in CT angiographic fashion.    ?The brain does not show any evidence of stroke, mass, hemorrhage, hydrocephalus, or extraaxial collection. All major circle of Willis branches are patent. Both vertebral arteries are patent to form the basilar artery.    ?IMPRESSION  ?Normal head CT.  ? ? ?REVIEW OF SYSTEMS: Out of a complete 14 system review of symptoms, the patient complains only of the following symptoms, emory loss, paranoia, and all other reviewed systems are negative. ? ? ?ALLERGIES: ?No Known Allergies ? ? ?HOME MEDICATIONS: ?Outpatient Medications Prior to Visit  ?Medication Sig Dispense Refill  ? atorvastatin (LIPITOR) 40 MG tablet Take by mouth. Take 40 mg one day  and 20 mg the next day (alternating)    ? donepezil (ARICEPT) 10 MG tablet Take 1 tablet (10 mg total) by mouth at bedtime. 90 tablet 4  ? metFORMIN (GLUCOPHAGE-XR) 500 MG 24 hr tablet Take 1,000 mg by mouth 2 (two) times daily.     ? metoprolol (TOPROL-XL) 50 MG 24 hr tablet Take 50 mg by mouth daily.     ? ?No facility-administered medications prior to visit.  ? ? ? ?PAST MEDICAL HISTORY: ?Past Medical History:  ?Diagnosis Date  ? Anxiety   ? Atrial fibrillation (HCC)   ? Atypical chest pain   ? with Lexiscan test normal, with LV Efx 74%  ? CAD (coronary artery disease)   ? CT coronary calcium study showed 3 vessel CAD (86 percentile), had 10/2018 stress echo showing normal LV Efx and poor exercise intolerance, started on Atorvastatin by cards  ? Chronic insomnia   ? with snoring,  sleep study recommended, in 2019 declined to have screening nocturnal pulse oximetry study  ? Diabetes mellitus   ? Diverticulosis   ? Elevated LFTs   ? Fatty liver   ? Gallstones 02/2017  ? without obstructi

## 2021-05-26 NOTE — Patient Instructions (Addendum)
Below is our plan: ? ?We will continue donepezil (Aricept) 10mg  daily. We will start memantine (Namenda) 5mg  twice daily. Start 5mg  daily at bedtime for 1 weeks. If well tolerated increase dose to 5mg  twice daily. We can increase slowly if well tolerated. Message me if she is doing well in about 3 months and I will guide you on how to increase at that time.  ? ?Please make sure you are staying well hydrated. I recommend 50-60 ounces daily. Well balanced diet and regular exercise encouraged. Consistent sleep schedule with 6-8 hours recommended.  ? ?Please continue follow up with care team as directed.  ? ?Follow up with me in 4-6 months  ? ?You may receive a survey regarding today's visit. I encourage you to leave honest feed back as I do use this information to improve patient care. Thank you for seeing me today!  ? ?Management of Memory Problems ?  ?There are some general things you can do to help manage your memory problems.  Your memory may not in fact recover, but by using techniques and strategies you will be able to manage your memory difficulties better. ?  ?1)  Establish a routine. ?Try to establish and then stick to a regular routine.  By doing this, you will get used to what to expect and you will reduce the need to rely on your memory.  Also, try to do things at the same time of day, such as taking your medication or checking your calendar first thing in the morning. ?Think about think that you can do as a part of a regular routine and make a list.  Then enter them into a daily planner to remind you.  This will help you establish a routine. ?  ?2)  Organize your environment. ?Organize your environment so that it is uncluttered.  Decrease visual stimulation.  Place everyday items such as keys or cell phone in the same place every day (ie.  Basket next to front door) ?Use post it notes with a brief message to yourself (ie. Turn off light, lock the door) ?Use labels to indicate where things go (ie. Which  cupboards are for food, dishes, etc.) ?Keep a notepad and pen by the telephone to take messages ?  ?3)  Memory Aids ?A diary or journal/notebook/daily planner ?Making a list (shopping list, chore list, to do list that needs to be done) ?Using an alarm as a reminder (kitchen timer or cell phone alarm) ?Using cell phone to store information (Notes, Calendar, Reminders) ?Calendar/White board placed in a prominent position ?Post-it notes ?  ?In order for memory aids to be useful, you need to have good habits.  It's no good remembering to make a note in your journal if you don't remember to look in it.  Try setting aside a certain time of day to look in journal. ?  ?4)  Improving mood and managing fatigue. ?There may be other factors that contribute to memory difficulties.  Factors, such as anxiety, depression and tiredness can affect memory. ?Regular gentle exercise can help improve your mood and give you more energy. ?Simple relaxation techniques may help relieve symptoms of anxiety ?Try to get back to completing activities or hobbies you enjoyed doing in the past. ?Learn to pace yourself through activities to decrease fatigue. ?Find out about some local support groups where you can share experiences with others. ?Try and achieve 7-8 hours of sleep at night. ? ?

## 2021-06-15 ENCOUNTER — Ambulatory Visit: Payer: Medicare Other | Admitting: Family Medicine

## 2021-11-19 ENCOUNTER — Other Ambulatory Visit: Payer: Self-pay | Admitting: Neurology

## 2021-11-30 NOTE — Patient Instructions (Signed)
Below is our plan:  We will continue donepezil 10mg  daily. I will increase memantine to 10mg  twice daily. You can use the 5mg  tablets and take two tablets twice daily until you run out. I will call in 10mg  tablets. You will take 1 tablet twice daily when you get the new prescription.   Please make sure you are staying well hydrated. I recommend 50-60 ounces daily. Well balanced diet and regular exercise encouraged. Consistent sleep schedule with 6-8 hours recommended.   Please continue follow up with care team as directed.   Follow up with me in 6 months   You may receive a survey regarding today's visit. I encourage you to leave honest feed back as I do use this information to improve patient care. Thank you for seeing me today!   Management of Memory Problems   There are some general things you can do to help manage your memory problems.  Your memory may not in fact recover, but by using techniques and strategies you will be able to manage your memory difficulties better.   1)  Establish a routine. Try to establish and then stick to a regular routine.  By doing this, you will get used to what to expect and you will reduce the need to rely on your memory.  Also, try to do things at the same time of day, such as taking your medication or checking your calendar first thing in the morning. Think about think that you can do as a part of a regular routine and make a list.  Then enter them into a daily planner to remind you.  This will help you establish a routine.   2)  Organize your environment. Organize your environment so that it is uncluttered.  Decrease visual stimulation.  Place everyday items such as keys or cell phone in the same place every day (ie.  Basket next to front door) Use post it notes with a brief message to yourself (ie. Turn off light, lock the door) Use labels to indicate where things go (ie. Which cupboards are for food, dishes, etc.) Keep a notepad and pen by the telephone  to take messages   3)  Memory Aids A diary or journal/notebook/daily planner Making a list (shopping list, chore list, to do list that needs to be done) Using an alarm as a reminder (kitchen timer or cell phone alarm) Using cell phone to store information (Notes, Calendar, Reminders) Calendar/White board placed in a prominent position Post-it notes   In order for memory aids to be useful, you need to have good habits.  It's no good remembering to make a note in your journal if you don't remember to look in it.  Try setting aside a certain time of day to look in journal.   4)  Improving mood and managing fatigue. There may be other factors that contribute to memory difficulties.  Factors, such as anxiety, depression and tiredness can affect memory. Regular gentle exercise can help improve your mood and give you more energy. Exercise: there are short videos created by the [employer] on Health specially for older adults: https://bit.ly/2I30q97.  Mediterranean diet: which emphasizes fruits, vegetables, whole grains, legumes, fish, and other seafood; unsaturated fats such as olive oils; and low amounts of red meat, eggs, and sweets. A variation of this, called MIND [facility] Intervention for Neurodegenerative Delay) incorporates the DASH (Dietary Approaches to Stop Hypertension) diet, which has been shown to lower high blood pressure, a risk factor for Alzheimer's  disease. More information at: ExitMarketing.de.  Aerobic exercise that improve heart health is also good for the mind.  [employer] on Aging have short videos for exercises that you can do at home: BlindWorkshop.com.pt Simple relaxation techniques may help relieve symptoms of anxiety Try to get back to completing activities or hobbies you enjoyed doing in the past. Learn to pace yourself through activities to decrease fatigue. Find out  about some local support groups where you can share experiences with others. Try and achieve 7-8 hours of sleep at night.   Resources for Family/Caregiver  Online caregiver support groups can be found at WesternTunes.it or call Alzheimer's Association's 24/7 hotline: 8191827647. [facility] Counsel[facility] Program offers in-person, virtual support groups and individual counseling for both care partners and persons with memory loss. Call for more information at [phone].   Advanced care plan: there are two types of Power of Attorney: healthcare and durable. Healthcare POA is a designated person to make healthcare decisions on your behalf if you were too sick to make them yourself. This person can be selected and documented by your physician. Durable POA has to be set up with a lawyer who takes charge of your finances and estate if you were too sick or cognitively impaired to manage your finances accurately. You can find a local Elder Therapist, art here: [place].at.  Check out www.planyourlifespan.org, which will help you plan before a crisis and decide who will take care of life considerations in a circumstance where you may not be able to speak for yourself.   Helpful books (available on [employer] or your local bookstore):  By Dr. Carl Best: Keeping Love Alive as Memories Fade: The 5 Love Languages and the Alzheimer's Journey Nov 15, 2014 The Dementia Care Partner's Workbook: A Guide for Understanding, Education, and [employer] - July 15, 2017.  Both available for less than $15.   "Coping with behavior change in dementia: a family caregiver's guide" by Ricardo Jericho & Valora Piccolo "A Caregiver's Guide to Dementia: Using Activities and Other Strategies to Prevent, Reduce and Manage Behavioral Symptoms" by Mahala Menghini Gitlin and [employer].  Youth worker of Joy for the Person with Alzheimer's or Dementia" 4th edition by Tama High  Caregiver videos on common behaviors related to  dementia: PopulationGame.pl  [place] Caregiver Portal: free to sign up, links to local resources: [email or web address]

## 2021-11-30 NOTE — Progress Notes (Unsigned)
No chief complaint on file.   HISTORY OF PRESENT ILLNESS:  11/30/21 ALL:  Erskine SquibbJane returns for follow up for AD. She was last seen 05/2021. We continues donepezil 10mg  QD and added memantine 5mg  BID. Since,   05/26/2021 ALL: Jossie NgJane Bartell is a 76 y.o. female here today for follow up for dementia. She presents with her husband, Gene, who aids in history. She was last seen via Mychart visit 08/2020. She has continues donepezil 10mg  daily and seems to tolerate it well. Memory seems to wax and wane. She has more difficulty with hiding things around the home (ie phone, glasses). Husband has tracker on her phone. Delusions seem to be resolved. She continues to have some concerns of paranoia. She seems to do better when around people she is comfortable with. She has a good appetite. She is sleeping well. No falls or trouble walking.   HISTORY (copied from Dr Trevor MaceAhern's previous note)  09/07/2020: She is still cooks, not driving, they have Covid. We discussed below, a little tickle in the throat, no congestion or fever. They feel she is stable, not much different. No new behavioral issues. We will meet again in 6 months vis mychart and he will let me know when to prescribe memantine.   Rodenbough 05/27/2020: Formal neurocognitive testing consistent with late onset dementia c/w significant/severe cognitive deficits globally (see notes in chart) of Alzheimer's type with mild behavioral agitation. Reviewed Dr. Marvetta Gibbonsodenbough's notes, he recommended Aricept , active lifestyle, good diet and sleep, managing anxiety and behavior. Follow up testing in 9-12 weeks.   11/01/2019: FDG PET SCAN: Decreased relative cortical metabolism within the high parietal lobes is a finding suggestive Alzheimer's type pathology. Relatively subtle finding.   No evidence of frontotemporal dementia 07/2020: MRI brain : IMPRESSION: This MRI of the brain without contrast shows the following: 1.   Scattered T2/FLAIR hyperintense foci in the  hemispheres consistent with mild chronic microvascular ischemic change.  This is unchanged compared to the 10/29/2019 MRI. 2.   No mid or large vessel strokes. 3.   No acute findings.   07/2020: MRA head : IMPRESSION: This MR angiogram of the intracranial arteries shows the following: 1.   Mild stenosis within the clinoid segment of the left internal carotid artery. 2.   Moderate stenosis in the P2 segment of the left posterior cerebral artery.     Personally reviewed images and agree with above    HPI:  Jossie NgJane Franssen is a 76 y.o. female here as requested by Jarome MatinPaterson, Daniel, MD for forgetfulness. PMHx HLD, SVT, elevated LFTs/fatty liver, SM, afib, anxiety, CAD, .  I reviewed Dr. Norval GablePatterson's notes: In the office in May of this year MMSE was 16 out of 30 with points lost in orientation, counting backwards, was only able to name 2 out of the 3 items she was told, she was never a smoker, she reports that she is becoming more forgetful and she would lose things, also at night she gets agitated, she frequently misplaces things, never gets lost, but she does forget names and repeats questions.  Dr. Jarold MottoPatterson noted moderate impairment in short-term memory, slowly progressive and is most likely from Alzheimer's disease, in the past her vitamin B12 has been normal with supplementation, they did check a B12 level, TSH and RPR to rule out secondary causes of dementia and she was started on Aricept, her poor quality of sleep is chronic and may have obstructive sleep apnea contributing to forgetfulness and difficult to control hypertension.  She also reported snoring and mild daytime fatigue but declined sleep test, she also has chronic insomnia yet still declined to have screening, no stated personal or family history of Alzheimer's in the notes from Dr. Sharlett Iles, examination showed normal cardiovascular, musculoskeletal, neurologic exams but showed reduced short-term memory and mildly anxious mood and affect   Patient  is here with her husband who also provides much information.  She has had slowly progressive memory loss and forgetfulness, started worsening about 6 to 8 months ago, she is worried about it and it makes her feel intimidated like she wants to the right thing, she stopped atorvastatin per cardiology for few weeks but that did not help. They state no concerns prior to 6-8 months ago, husband says she started misplacing pocket book, phone, husband had to put trackers on each one, not in unusual places but under the bed or behind a couch pillow. Wife says she thinks all is fine, its mostly her husband complaints. She is cooking well, not forgetting ingredients and sometimes uses the recipe, the only problem is she is misplacing the same things. No accidents in the car, although husband has always driven more, but if she does drive she doesn't get lost, husband has always managed the finances because he is a computer guy, she has her own checking account a debit card and no confusion or spending money inappropriately. She may ask the same question 2-3 times in the same day/conversatio but not frequently, no hallucinations but every night she hides her pocket book because she is being careful but husband says she may think someone is trying to take it (this just started as she started losing things). Sometimes she acuses her husband maybe of having a girlfriend, she denies it, she says it when she can;t find something and says maybe husband's girlfriend took it (husband seems to think she is being serious and has it in her mind). If he tries to challenge her, she gets very agitated and he feels accused of something. Sleeping well. No waking up or vivid dreams. Husband takes care of everything, prints out appointments, details it for her, manages the finances so hard to say how impaired she is with more short-term memory loss. Mother had dementia, unknown type, mother was 16s. No hx of alcohol or drug use or smoking.  Denies depression or anxiety or a hx of psychiatric disorders.   Reviewed notes, labs and imaging from outside physicians, which showed;   Labs collected Jun 22, 2018 include CMP with BUN 15 and creatinine 0.8 otherwise unremarkable, TSH 1.42 normal.   hgba1c 6.76 03/2019   CT head 2015: reviewed images, HEAD CT WITH AND WITHOUT CONTRAST  Spiral scanning is performed before and after intravenous contrast administration. The study is done in CT angiographic fashion.    The brain does not show any evidence of stroke, mass, hemorrhage, hydrocephalus, or extraaxial collection. All major circle of Willis branches are patent. Both vertebral arteries are patent to form the basilar artery.    IMPRESSION  Normal head CT.    REVIEW OF SYSTEMS: Out of a complete 14 system review of symptoms, the patient complains only of the following symptoms, emory loss, paranoia, and all other reviewed systems are negative.   ALLERGIES: No Known Allergies   HOME MEDICATIONS: Outpatient Medications Prior to Visit  Medication Sig Dispense Refill   atorvastatin (LIPITOR) 40 MG tablet Take by mouth. Take 40 mg one day and 20 mg the next day (alternating)  donepezil (ARICEPT) 10 MG tablet TAKE 1 TABLET(10 MG) BY MOUTH AT BEDTIME 90 tablet 4   memantine (NAMENDA) 5 MG tablet Take 1 tablet (5 mg total) by mouth 2 (two) times daily. 180 tablet 3   metFORMIN (GLUCOPHAGE-XR) 500 MG 24 hr tablet Take 1,000 mg by mouth 2 (two) times daily.      metoprolol (TOPROL-XL) 50 MG 24 hr tablet Take 50 mg by mouth daily.      No facility-administered medications prior to visit.     PAST MEDICAL HISTORY: Past Medical History:  Diagnosis Date   Anxiety    Atrial fibrillation ([place])    Atypical chest pain    with Lexiscan test normal, with LV Efx 74%   CAD (coronary artery disease)    CT coronary calcium study showed 3 vessel CAD (86 percentile), had 10/2018 stress echo showing normal LV Efx and poor exercise intolerance,  started on Atorvastatin by cards   Chronic insomnia    with snoring, sleep study recommended, in 2019 declined to have screening nocturnal pulse oximetry study   Diabetes mellitus    Diverticulosis    Elevated LFTs    Fatty liver    Gallstones 02/2017   without obstruction or infection. nuclear medicine hepatobiliary study in December 2019 showed normal gallbladder function   GERD (gastroesophageal reflux disease)    Hyperlipidemia    IBS (irritable bowel syndrome)    Positive H. pylori test 05/2003   positive x 2   SVT (supraventricular tachycardia) ([place])    Uterine hyperplasia    VAIN I (vaginal intraepithelial neoplasia grade I)      PAST SURGICAL HISTORY: Past Surgical History:  Procedure Laterality Date   BREAST BIOPSY  03/1999   left   CARDIAC CATHETERIZATION  2008   without significant CAD   COLONOSCOPY  2017   showed sigmoid diverticulosis with no polyps (f/u in 2022)   COLPOSCOPY  2007   DILATION AND CURETTAGE, DIAGNOSTIC / THERAPEUTIC     ELECTROPHYSIOLOGIC STUDY  2005   Multiple atrial arrhythmias, 2 flutters with different atrial activation sequence, P waves, irregular rhythm, atrial fibrillation   ESOPHAGOGASTRODUODENOSCOPY  11/2012   showed mild gastritis    LAPAROSCOPY  1974   LIVER BIOPSY  03/1999   PELVIC LAPAROSCOPY     pr denies breast biopsy     REPAIR RECTOCELE     ROTATOR CUFF REPAIR  10/2000   left   thumb surgery  10/2000   TONSILLECTOMY     VAGINAL HYSTERECTOMY  03/23/04     FAMILY HISTORY: Family History  Problem Relation Age of Onset   Heart disease Father    Colon cancer Father 2   Hypertension Father    Heart attack Mother    Diabetes Mother    Hypertension Mother    Colon cancer Mother 102   Heart disease Mother    Dementia Mother    Colon cancer Brother 52   Heart attack Maternal Grandfather    Heart attack Maternal Grandmother    Heart attack Paternal Grandfather    Heart disease Sister    Diabetes Sister    Hypertension  Sister    Cancer Sister        UTERINE   Uterine cancer Sister    Cancer Sister        UTERINE     SOCIAL HISTORY: Social History   Socioeconomic History   Marital status: Married    Spouse name: Not on file   Number of  children: Not on file   Years of education: Not on file   Highest education level: High school graduate  Occupational History   Not on file  Tobacco Use   Smoking status: Never   Smokeless tobacco: Never  Vaping Use   Vaping Use: Never used  Substance and Sexual Activity   Alcohol use: No    Alcohol/week: 0.0 standard drinks of alcohol   Drug use: No   Sexual activity: Yes    Birth control/protection: Post-menopausal, Surgical    Comment: intercourse age 66, sexual partners less than 5  Other Topics Concern   Not on file  Social History Narrative   ** Merged History Encounter **       Lives at home with husband Right handed Caffeine: "some", maybe two in a week   Social Determinants of Radio broadcast assistant Strain: Not on file  Food Insecurity: Not on file  Transportation Needs: Not on file  Physical Activity: Not on file  Stress: Not on file  Social Connections: Not on file  Intimate Partner Violence: Not on file     PHYSICAL EXAM  There were no vitals filed for this visit.  There is no height or weight on file to calculate BMI.  Generalized: Well developed, in no acute distress  Cardiology: normal rate and rhythm, no murmur auscultated  Respiratory: clear to auscultation bilaterally    Neurological examination  Mentation: Alert, not oriented to time, she is oriented to place, able to assist with some history taking. Follows all commands speech and language fluent Cranial nerve II-XII: Pupils were equal round reactive to light. Extraocular movements were full, visual field were full on confrontational test. Facial sensation and strength were normal. Head turning and shoulder shrug  were normal and symmetric. Motor: The motor  testing reveals 5 over 5 strength of all 4 extremities. Good symmetric motor tone is noted throughout.  Sensory: Sensory testing is intact to soft touch on all 4 extremities. No evidence of extinction is noted.  Coordination: Cerebellar testing reveals good finger-nose-finger and heel-to-shin bilaterally.  Gait and station: Gait is normal. Tandem not assessed    DIAGNOSTIC DATA (LABS, IMAGING, TESTING) - I reviewed patient records, labs, notes, testing and imaging myself where available.  Lab Results  Component Value Date   WBC 11.2 (H) 10/15/2019   HGB 15.2 10/15/2019   HCT 46.2 10/15/2019   MCV 92 10/15/2019   PLT 239 10/15/2019      Component Value Date/Time   NA 138 10/15/2019 1100   K 5.1 10/15/2019 1100   CL 99 10/15/2019 1100   CO2 22 10/15/2019 1100   GLUCOSE 131 (H) 10/15/2019 1100   GLUCOSE 82 06/14/2015 1521   BUN 14 10/15/2019 1100   CREATININE 0.85 10/15/2019 1100   CALCIUM 10.5 (H) 10/15/2019 1100   PROT 7.1 10/15/2019 1100   ALBUMIN 4.7 10/15/2019 1100   AST 17 10/15/2019 1100   ALT 21 10/15/2019 1100   ALKPHOS 109 10/15/2019 1100   BILITOT 0.4 10/15/2019 1100   GFRNONAA 68 10/15/2019 1100   GFRAA 78 10/15/2019 1100   Lab Results  Component Value Date   CHOL 263 (HH) 03/23/2007   HDL 34.4 (L) 03/23/2007   LDLDIRECT 196.8 03/23/2007   TRIG 168 (H) 03/23/2007   CHOLHDL 7.6 CALC 03/23/2007   No results found for: "HGBA1C" Lab Results  Component Value Date   VITAMINB12 344 10/15/2019   Lab Results  Component Value Date   TSH 1.950 10/15/2019  05/26/2021    9:48 AM 10/15/2019    9:35 AM  MMSE - Mini Mental State Exam  Orientation to time 1 4  Orientation to Place 4 3  Registration 3 3  Attention/ Calculation 0 0  Recall 1 0  Language- name 2 objects 1 2  Language- repeat 1 1  Language- follow 3 step command 3 3  Language- read & follow direction 1 1  Write a sentence 1 1  Copy design 0 0  Total score 16 18         No data to  display           ASSESSMENT AND PLAN  76 y.o. year old female  has a past medical history of Anxiety, Atrial fibrillation ([place]), Atypical chest pain, CAD (coronary artery disease), Chronic insomnia, Diabetes mellitus, Diverticulosis, Elevated LFTs, Fatty liver, Gallstones (02/2017), GERD (gastroesophageal reflux disease), Hyperlipidemia, IBS (irritable bowel syndrome), Positive H. pylori test (05/2003), SVT (supraventricular tachycardia) ([place]), Uterine hyperplasia, and VAIN I (vaginal intraepithelial neoplasia grade I). here with    No diagnosis found.  Yareli Menken is doing fairly well with exception of more difficulty with hiding objects around the home and intermittent paranoia. Delusions have resolved. She seems to be tolerating donepezil. No obvious adverse effects. Heart sounds are normal on exam. We will continue 10mg  daily. I will start memantine 5mg  BID, starting QD dosing for 1 weeks then increasing to 5mg  BID. Potential side effects reviewed with Mr Yao. I have encouraged them to increase daily activity and work on [employer].  Healthy lifestyle habits encouraged. She will follow up with care team as directed. She will return to see me in 4-6 months, sooner if needed. She verbalizes understanding and agreement with this plan.   No orders of the defined types were placed in this encounter.    No orders of the defined types were placed in this encounter.    Debbora Presto, MSN, FNP-C 11/30/2021, 9:36 AM  [facility] [address], [place], [place] [address] [phone]

## 2021-12-01 ENCOUNTER — Encounter: Payer: Self-pay | Admitting: Family Medicine

## 2021-12-01 ENCOUNTER — Ambulatory Visit: Payer: Medicare Other | Admitting: Family Medicine

## 2021-12-01 VITALS — BP 132/78 | HR 79 | Ht 64.0 in | Wt 127.0 lb

## 2021-12-01 DIAGNOSIS — F02818 Dementia in other diseases classified elsewhere, unspecified severity, with other behavioral disturbance: Secondary | ICD-10-CM

## 2021-12-01 DIAGNOSIS — G301 Alzheimer's disease with late onset: Secondary | ICD-10-CM | POA: Diagnosis not present

## 2021-12-01 MED ORDER — MEMANTINE HCL 10 MG PO TABS: 10.0000 mg | ORAL_TABLET | Freq: Two times a day (BID) | ORAL | 3 refills | Status: DC

## 2022-05-16 ENCOUNTER — Telehealth: Payer: Self-pay | Admitting: Family Medicine

## 2022-05-16 NOTE — Telephone Encounter (Signed)
LVM and sent mychart msg informing pt of need to reschedule 06/02/22 appointment - NP out

## 2022-06-02 ENCOUNTER — Ambulatory Visit: Payer: Medicare Other | Admitting: Family Medicine

## 2022-07-27 ENCOUNTER — Encounter: Payer: Self-pay | Admitting: Family Medicine

## 2022-07-27 ENCOUNTER — Ambulatory Visit: Payer: Medicare Other | Admitting: Family Medicine

## 2022-07-27 VITALS — BP 141/79 | HR 72 | Ht 64.0 in | Wt 128.5 lb

## 2022-07-27 DIAGNOSIS — F02818 Dementia in other diseases classified elsewhere, unspecified severity, with other behavioral disturbance: Secondary | ICD-10-CM | POA: Diagnosis not present

## 2022-07-27 DIAGNOSIS — G301 Alzheimer's disease with late onset: Secondary | ICD-10-CM | POA: Diagnosis not present

## 2022-07-27 DIAGNOSIS — F22 Delusional disorders: Secondary | ICD-10-CM | POA: Diagnosis not present

## 2022-07-27 MED ORDER — DONEPEZIL HCL 10 MG PO TABS: 10.0000 mg | ORAL_TABLET | Freq: Every day | ORAL | 4 refills | Status: AC

## 2022-07-27 MED ORDER — MEMANTINE HCL 10 MG PO TABS: 10.0000 mg | ORAL_TABLET | Freq: Two times a day (BID) | ORAL | 3 refills | Status: DC

## 2022-07-27 NOTE — Progress Notes (Signed)
Chief Complaint  Patient presents with   Room 2    Pt is here with her Husband. Pt states that she thinks everything has been okay since last appointment. Pt's husband states that she gets anxious.      HISTORY OF PRESENT ILLNESS:  07/27/22 ALL:  Heidi Chase returns for follow up for AD. She was last seen 11/2021 and doing fairly well. MMSE 19/30. We continued donepezil 10mg  QD and increased memantine to 10mg  BID. Since, she continues to do well. She presents with Mr Rowe who aides with history. He reports memory seems stable. No trouble walking. She is sleeping well. Appetite is good. Mood seems good most days. She does get irritable at times but this has not been troublesome. She continues to have some paranoia. She will hide things on occasion. She is able to perform ADLs independently. He does help with meds. She does not drive. They have a great support system with children and friends. She is followed closely by PCP.   12/01/2021 ALL: Heidi Chase returns for follow up for AD. She was last seen 05/2021. We continues donepezil 10mg  QD and added memantine 5mg  BID. Since, she is doing well. Memory waxes and wanes. She has more trouble remembering where she placed items. Otherwise, stable. Eating normally. Sleeping well. Paranoia stable. She likes to eat out. She does not drive. Mr Comes does meds.   05/26/2021 ALL: Heidi Chase is a 77 y.o. female here today for follow up for dementia. She presents with her husband, Heidi Chase, who aids in history. She was last seen via Mychart visit 08/2020. She has continues donepezil 10mg  daily and seems to tolerate it well. Memory seems to wax and wane. She has more difficulty with hiding things around the home (ie phone, glasses). Husband has tracker on her phone. Delusions seem to be resolved. She continues to have some concerns of paranoia. She seems to do better when around people she is comfortable with. She has a good appetite. She is sleeping well. No falls or trouble  walking.   HISTORY (copied from Dr Trevor Mace previous note)  09/07/2020: She is still cooks, not driving, they have Covid. We discussed below, a little tickle in the throat, no congestion or fever. They feel she is stable, not much different. No new behavioral issues. We will meet again in 6 months vis mychart and he will let me know when to prescribe memantine.   [place] 05/27/2020: Formal neurocognitive testing consistent with late onset dementia c/w significant/severe cognitive deficits globally (see notes in chart) of Alzheimer's type with mild behavioral agitation. Reviewed Dr. Marvetta Gibbons notes, he recommended Aricept , active lifestyle, good diet and sleep, managing anxiety and behavior. Follow up testing in 9-12 weeks.   11/01/2019: FDG PET SCAN: Decreased relative cortical metabolism within the high parietal lobes is a finding suggestive Alzheimer's type pathology. Relatively subtle finding.   No evidence of frontotemporal dementia 07/2020: MRI brain : IMPRESSION: This MRI of the brain without contrast shows the following: 1.   Scattered T2/FLAIR hyperintense foci in the hemispheres consistent with mild chronic microvascular ischemic change.  This is unchanged compared to the 10/29/2019 MRI. 2.   No mid or large vessel strokes. 3.   No acute findings.   07/2020: MRA head : IMPRESSION: This MR angiogram of the intracranial arteries shows the following: 1.   Mild stenosis within the clinoid segment of the left internal carotid artery. 2.   Moderate stenosis in the P2 segment of the left posterior  cerebral artery.     Personally reviewed images and agree with above    HPI:  Heidi Chase is a 77 y.o. female here as requested by Jarome Matin, MD for forgetfulness. PMHx HLD, SVT, elevated LFTs/fatty liver, SM, afib, anxiety, CAD, .  I reviewed Dr. Norval Gable notes: In the office in May of this year MMSE was 16 out of 30 with points lost in orientation, counting backwards, was only able to  name 2 out of the 3 items she was told, she was never a smoker, she reports that she is becoming more forgetful and she would lose things, also at night she gets agitated, she frequently misplaces things, never gets lost, but she does forget names and repeats questions.  Dr. Jarold Motto noted moderate impairment in short-term memory, slowly progressive and is most likely from Alzheimer's disease, in the past her vitamin B12 has been normal with supplementation, they did check a B12 level, TSH and RPR to rule out secondary causes of dementia and she was started on Aricept, her poor quality of sleep is chronic and may have obstructive sleep apnea contributing to forgetfulness and difficult to control hypertension.  She also reported snoring and mild daytime fatigue but declined sleep test, she also has chronic insomnia yet still declined to have screening, no stated personal or family history of Alzheimer's in the notes from Dr. Jarold Motto, examination showed normal cardiovascular, musculoskeletal, neurologic exams but showed reduced short-term memory and mildly anxious mood and affect   Patient is here with her husband who also provides much information.  She has had slowly progressive memory loss and forgetfulness, started worsening about 6 to 8 months ago, she is worried about it and it makes her feel intimidated like she wants to the right thing, she stopped atorvastatin per cardiology for few weeks but that did not help. They state no concerns prior to 6-8 months ago, husband says she started misplacing pocket book, phone, husband had to put trackers on each one, not in unusual places but under the bed or behind a couch pillow. Wife says she thinks all is fine, its mostly her husband complaints. She is cooking well, not forgetting ingredients and sometimes uses the recipe, the only problem is she is misplacing the same things. No accidents in the car, although husband has always driven more, but if she does drive  she doesn't get lost, husband has always managed the finances because he is a computer guy, she has her own checking account a debit card and no confusion or spending money inappropriately. She may ask the same question 2-3 times in the same day/conversatio but not frequently, no hallucinations but every night she hides her pocket book because she is being careful but husband says she may think someone is trying to take it (this just started as she started losing things). Sometimes she acuses her husband maybe of having a girlfriend, she denies it, she says it when she can;t find something and says maybe husband's girlfriend took it (husband seems to think she is being serious and has it in her mind). If he tries to challenge her, she gets very agitated and he feels accused of something. Sleeping well. No waking up or vivid dreams. Husband takes care of everything, prints out appointments, details it for her, manages the finances so hard to say how impaired she is with more short-term memory loss. Mother had dementia, unknown type, mother was 59s. No hx of alcohol or drug use or smoking. Denies depression  or anxiety or a hx of psychiatric disorders.   Reviewed notes, labs and imaging from outside physicians, which showed;   Labs collected Jun 22, 2018 include CMP with BUN 15 and creatinine 0.8 otherwise unremarkable, TSH 1.42 normal.   hgba1c 6.76 03/2019   CT head 2015: reviewed images, HEAD CT WITH AND WITHOUT CONTRAST  Spiral scanning is performed before and after intravenous contrast administration. The study is done in CT angiographic fashion.    The brain does not show any evidence of stroke, mass, hemorrhage, hydrocephalus, or extraaxial collection. All major circle of Willis branches are patent. Both vertebral arteries are patent to form the basilar artery.    IMPRESSION  Normal head CT.    REVIEW OF SYSTEMS: Out of a complete 14 system review of symptoms, the patient complains only of the  following symptoms, memory loss, paranoia, and all other reviewed systems are negative.   ALLERGIES: No Known Allergies   HOME MEDICATIONS: Outpatient Medications Prior to Visit  Medication Sig Dispense Refill   atorvastatin (LIPITOR) 40 MG tablet Take by mouth. Take 40 mg one day and 20 mg the next day (alternating)     glipiZIDE (GLUCOTROL) 10 MG tablet Take 10 mg by mouth daily before breakfast.     metFORMIN (GLUCOPHAGE-XR) 500 MG 24 hr tablet Take 1,000 mg by mouth 2 (two) times daily.      metoprolol (TOPROL-XL) 50 MG 24 hr tablet Take 50 mg by mouth daily.      donepezil (ARICEPT) 10 MG tablet TAKE 1 TABLET(10 MG) BY MOUTH AT BEDTIME 90 tablet 4   memantine (NAMENDA) 10 MG tablet Take 1 tablet (10 mg total) by mouth 2 (two) times daily. 180 tablet 3   No facility-administered medications prior to visit.     PAST MEDICAL HISTORY: Past Medical History:  Diagnosis Date   Anxiety    Atrial fibrillation (HCC)    Atypical chest pain    with Lexiscan test normal, with LV Efx 74%   CAD (coronary artery disease)    CT coronary calcium study showed 3 vessel CAD (86 percentile), had 10/2018 stress echo showing normal LV Efx and poor exercise intolerance, started on Atorvastatin by cards   Chronic insomnia    with snoring, sleep study recommended, in 2019 declined to have screening nocturnal pulse oximetry study   Diabetes mellitus    Diverticulosis    Elevated LFTs    Fatty liver    Gallstones 02/2017   without obstruction or infection. nuclear medicine hepatobiliary study in December 2019 showed normal gallbladder function   GERD (gastroesophageal reflux disease)    Hyperlipidemia    IBS (irritable bowel syndrome)    Positive H. pylori test 05/2003   positive x 2   SVT (supraventricular tachycardia)    Uterine hyperplasia    VAIN I (vaginal intraepithelial neoplasia grade I)      PAST SURGICAL HISTORY: Past Surgical History:  Procedure Laterality Date   BREAST BIOPSY   03/1999   left   CARDIAC CATHETERIZATION  2008   without significant CAD   COLONOSCOPY  2017   showed sigmoid diverticulosis with no polyps (f/u in 2022)   COLPOSCOPY  2007   DILATION AND CURETTAGE, DIAGNOSTIC / THERAPEUTIC     ELECTROPHYSIOLOGIC STUDY  2005   Multiple atrial arrhythmias, 2 flutters with different atrial activation sequence, P waves, irregular rhythm, atrial fibrillation   ESOPHAGOGASTRODUODENOSCOPY  11/2012   showed mild gastritis    LAPAROSCOPY  1974   LIVER  BIOPSY  03/1999   PELVIC LAPAROSCOPY     pr denies breast biopsy     REPAIR RECTOCELE     ROTATOR CUFF REPAIR  10/2000   left   thumb surgery  10/2000   TONSILLECTOMY     VAGINAL HYSTERECTOMY  03/23/04     FAMILY HISTORY: Family History  Problem Relation Age of Onset   Heart disease Father    Colon cancer Father 61   Hypertension Father    Heart attack Mother    Diabetes Mother    Hypertension Mother    Colon cancer Mother 79   Heart disease Mother    Dementia Mother    Colon cancer Brother 55   Heart attack Maternal Grandfather    Heart attack Maternal Grandmother    Heart attack Paternal Grandfather    Heart disease Sister    Diabetes Sister    Hypertension Sister    Cancer Sister        UTERINE   Uterine cancer Sister    Cancer Sister        UTERINE     SOCIAL HISTORY: Social History   Socioeconomic History   Marital status: Married    Spouse name: Not on file   Number of children: Not on file   Years of education: Not on file   Highest education level: High school graduate  Occupational History   Not on file  Tobacco Use   Smoking status: Never   Smokeless tobacco: Never  Vaping Use   Vaping Use: Never used  Substance and Sexual Activity   Alcohol use: No    Alcohol/week: 0.0 standard drinks of alcohol   Drug use: No   Sexual activity: Yes    Birth control/protection: Post-menopausal, Surgical    Comment: intercourse age 40, sexual partners less than 5  Other Topics  Concern   Not on file  Social History Narrative   ** Merged History Encounter **       Lives at home with husband Right handed Caffeine: "some", maybe two in a week   Social Determinants of Corporate investment banker Strain: Not on file  Food Insecurity: Not on file  Transportation Needs: Not on file  Physical Activity: Not on file  Stress: Not on file  Social Connections: Not on file  Intimate Partner Violence: Not on file     PHYSICAL EXAM  Vitals:   07/27/22 1101  BP: (!) 141/79  Pulse: 72  Weight: 128 lb 8 oz (58.3 kg)  Height: 5\' 4"  (1.626 m)     Body mass index is 22.06 kg/m.  Generalized: Well developed, in no acute distress  Cardiology: normal rate and rhythm, no murmur auscultated  Respiratory: clear to auscultation bilaterally    Neurological examination  Mentation: Alert, not oriented to time, she is oriented to place, able to assist with some history taking. Follows all commands speech and language fluent Cranial nerve II-XII: Pupils were equal round reactive to light. Extraocular movements were full, visual field were full on confrontational test. Facial sensation and strength were normal. Head turning and shoulder shrug  were normal and symmetric. Motor: The motor testing reveals 5 over 5 strength of all 4 extremities. Good symmetric motor tone is noted throughout.  Sensory: Sensory testing is intact to soft touch on all 4 extremities. No evidence of extinction is noted.  Coordination: Cerebellar testing reveals good finger-nose-finger and heel-to-shin bilaterally.  Gait and station: Gait is normal. Tandem not assessed  DIAGNOSTIC DATA (LABS, IMAGING, TESTING) - I reviewed patient records, labs, notes, testing and imaging myself where available.  Lab Results  Component Value Date   WBC 11.2 (H) 10/15/2019   HGB 15.2 10/15/2019   HCT 46.2 10/15/2019   MCV 92 10/15/2019   PLT 239 10/15/2019      Component Value Date/Time   NA 138 10/15/2019  1100   K 5.1 10/15/2019 1100   CL 99 10/15/2019 1100   CO2 22 10/15/2019 1100   GLUCOSE 131 (H) 10/15/2019 1100   GLUCOSE 82 06/14/2015 1521   BUN 14 10/15/2019 1100   CREATININE 0.85 10/15/2019 1100   CALCIUM 10.5 (H) 10/15/2019 1100   PROT 7.1 10/15/2019 1100   ALBUMIN 4.7 10/15/2019 1100   AST 17 10/15/2019 1100   ALT 21 10/15/2019 1100   ALKPHOS 109 10/15/2019 1100   BILITOT 0.4 10/15/2019 1100   GFRNONAA 68 10/15/2019 1100   GFRAA 78 10/15/2019 1100   Lab Results  Component Value Date   CHOL 263 (HH) 03/23/2007   HDL 34.4 (L) 03/23/2007   LDLDIRECT 196.8 03/23/2007   TRIG 168 (H) 03/23/2007   CHOLHDL 7.6 CALC 03/23/2007   No results found for: "HGBA1C" Lab Results  Component Value Date   VITAMINB12 344 10/15/2019   Lab Results  Component Value Date   TSH 1.950 10/15/2019       07/27/2022   11:04 AM 12/01/2021   12:56 PM 05/26/2021    9:48 AM  MMSE - Mini Mental State Exam  Orientation to time 1 4 1   Orientation to Place 4 4 4   Registration 3 3 3   Attention/ Calculation 5 0 0  Recall 0 0 1  Language- name 2 objects 2 2 1   Language- repeat 0 1 1  Language- follow 3 step command 3 3 3   Language- read & follow direction 1 1 1   Write a sentence 1 1 1   Copy design 1 0 0  Total score 21 19 16          No data to display           ASSESSMENT AND PLAN  77 y.o. year old female  has a past medical history of Anxiety, Atrial fibrillation (HCC), Atypical chest pain, CAD (coronary artery disease), Chronic insomnia, Diabetes mellitus, Diverticulosis, Elevated LFTs, Fatty liver, Gallstones (02/2017), GERD (gastroesophageal reflux disease), Hyperlipidemia, IBS (irritable bowel syndrome), Positive H. pylori test (05/2003), SVT (supraventricular tachycardia), Uterine hyperplasia, and VAIN I (vaginal intraepithelial neoplasia grade I). 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  MMSE previously 19/30, now 21/30. She will continue donepezil 10mg  daily and memantine 10mg  BID.  I have encouraged them to continue daily activity and memory compensation strategies. Healthy lifestyle habits encouraged. She will follow up with care team as directed. Mr Taibi request future follow up with PCP. I have taken care of refills for the next year. May continue through PCP. I am happy to see them if symptoms worsen. They verbalize understanding and agreement with this plan.   No orders of the defined types were placed in this encounter.    Meds ordered this encounter  Medications   memantine (NAMENDA) 10 MG tablet    Sig: Take 1 tablet (10 mg total) by mouth 2 (two) times daily.    Dispense:  180 tablet    Refill:  3    Order Specific Question:   Supervising  Provider    Answer:   Anson Fret [[id number]]   donepezil (ARICEPT) 10 MG tablet    Sig: Take 1 tablet (10 mg total) by mouth at bedtime.    Dispense:  90 tablet    Refill:  4    Order Specific Question:   Supervising Provider    Answer:   Anson Fret [[id number]]     Shawnie Dapper, MSN, FNP-C 07/27/2022, 11:56 AM  [facility] [address] [place], [place] [address] [phone]

## 2022-07-27 NOTE — Patient Instructions (Signed)
Below is our plan:  We will continue donepezil 10mg  daily and memantine 10mg  twice daily. I have sent refills for the next year. Please let me know if you need me!  Please make sure you are staying well hydrated. I recommend 50-60 ounces daily. Well balanced diet and regular exercise encouraged. Consistent sleep schedule with 6-8 hours recommended.   Please continue follow up with care team as directed.   Follow up with me as needed   You may receive a survey regarding today's visit. I encourage you to leave honest feed back as I do use this information to improve patient care. Thank you for seeing me today!   Management of Memory Problems   There are some general things you can do to help manage your memory problems.  Your memory may not in fact recover, but by using techniques and strategies you will be able to manage your memory difficulties better.   1)  Establish a routine. Try to establish and then stick to a regular routine.  By doing this, you will get used to what to expect and you will reduce the need to rely on your memory.  Also, try to do things at the same time of day, such as taking your medication or checking your calendar first thing in the morning. Think about think that you can do as a part of a regular routine and make a list.  Then enter them into a daily planner to remind you.  This will help you establish a routine.   2)  Organize your environment. Organize your environment so that it is uncluttered.  Decrease visual stimulation.  Place everyday items such as keys or cell phone in the same place every day (ie.  Basket next to front door) Use post it notes with a brief message to yourself (ie. Turn off light, lock the door) Use labels to indicate where things go (ie. Which cupboards are for food, dishes, etc.) Keep a notepad and pen by the telephone to take messages   3)  Memory Aids A diary or journal/notebook/daily planner Making a list (shopping list, chore list,  to do list that needs to be done) Using an alarm as a reminder (kitchen timer or cell phone alarm) Using cell phone to store information (Notes, Calendar, Reminders) Calendar/White board placed in a prominent position Post-it notes   In order for memory aids to be useful, you need to have good habits.  It's no good remembering to make a note in your journal if you don't remember to look in it.  Try setting aside a certain time of day to look in journal.   4)  Improving mood and managing fatigue. There may be other factors that contribute to memory difficulties.  Factors, such as anxiety, depression and tiredness can affect memory. Regular gentle exercise can help improve your mood and give you more energy. Exercise: there are short videos created by the [employer] on Health specially for older adults: https://bit.ly/2I30q97.  Mediterranean diet: which emphasizes fruits, vegetables, whole grains, legumes, fish, and other seafood; unsaturated fats such as olive oils; and low amounts of red meat, eggs, and sweets. A variation of this, called MIND (Mediterranean-DASH Intervention for Neurodegenerative Delay) incorporates the DASH (Dietary Approaches to Stop Hypertension) diet, which has been shown to lower high blood pressure, a risk factor for Alzheimer's disease. More information at: ExitMarketing.de.  Aerobic exercise that improve heart health is also good for the mind.  [employer] on Aging have  short videos for exercises that you can do at home: BlindWorkshop.com.pt Simple relaxation techniques may help relieve symptoms of anxiety Try to get back to completing activities or hobbies you enjoyed doing in the past. Learn to pace yourself through activities to decrease fatigue. Find out about some local support groups where you can share experiences with others. Try and achieve 7-8 hours of sleep at  night.   Resources for Family/Caregiver  Online caregiver support groups can be found at WesternTunes.it or call Alzheimer's Association's 24/7 hotline: (214)126-3275. [facility] Memory Counseling Program offers in-person, virtual support groups and individual counseling for both care partners and persons with memory loss. Call for more information at [phone].   Advanced care plan: there are two types of Power of Attorney: healthcare and durable. Healthcare POA is a designated person to make healthcare decisions on your behalf if you were too sick to make them yourself. This person can be selected and documented by your physician. Durable POA has to be set up with a lawyer who takes charge of your finances and estate if you were too sick or cognitively impaired to manage your finances accurately. You can find a local Elder Therapist, art here: [place]Ranch.at.  Check out www.planyourlifespan.org, which will help you plan before a crisis and decide who will take care of life considerations in a circumstance where you may not be able to speak for yourself.   Helpful books (available on [employer] or your local bookstore):  By Dr. Carl Best: Keeping Love Alive as Memories Fade: The 5 Love Languages and the Alzheimer's Journey Nov 15, 2014 The Dementia Care Partner's Workbook: A Guide for Understanding, Education, and [employer] - July 15, 2017.  Both available for less than $15.   "Coping with behavior change in dementia: a family caregiver's guide" by Ricardo Jericho & Valora Piccolo "A Caregiver's Guide to Dementia: Using Activities and Other Strategies to Prevent, Reduce and Manage Behavioral Symptoms" by Mahala Menghini Gitlin and [employer].  Youth worker of Joy for the Person with Alzheimer's or Dementia" 4th edition by [employer]  Caregiver videos on common behaviors related to dementia: PopulationGame.pl  [place] Caregiver Portal: free to sign up,  links to local resources: [email or web address]-caregivers.com/login

## 2023-01-02 ENCOUNTER — Other Ambulatory Visit: Payer: Self-pay

## 2023-01-02 MED ORDER — MEMANTINE HCL 10 MG PO TABS: 10.0000 mg | ORAL_TABLET | Freq: Two times a day (BID) | ORAL | 2 refills | Status: AC
